# Patient Record
Sex: Male | Born: 1955 | Race: Black or African American | Hispanic: No | Marital: Married
Health system: Southern US, Community
[De-identification: ages and names within clinical notes are randomized; demographics above are authoritative.]

## PROBLEM LIST (undated history)

## (undated) DIAGNOSIS — I639 Cerebral infarction, unspecified: Secondary | ICD-10-CM

## (undated) DIAGNOSIS — Z992 Dependence on renal dialysis: Secondary | ICD-10-CM

## (undated) DIAGNOSIS — N186 End stage renal disease: Secondary | ICD-10-CM

## (undated) DIAGNOSIS — I10 Essential (primary) hypertension: Secondary | ICD-10-CM

---

## 2017-07-13 ENCOUNTER — Other Ambulatory Visit: Payer: Self-pay

## 2017-07-13 ENCOUNTER — Emergency Department: Payer: Self-pay

## 2017-07-13 ENCOUNTER — Inpatient Hospital Stay
Admission: EM | Admit: 2017-07-13 | Discharge: 2017-08-10 | DRG: 871 | Disposition: E | Payer: Self-pay | Source: Other Acute Inpatient Hospital | Attending: Internal Medicine | Admitting: Internal Medicine

## 2017-07-13 ENCOUNTER — Inpatient Hospital Stay: Payer: Self-pay

## 2017-07-13 DIAGNOSIS — Z7189 Other specified counseling: Secondary | ICD-10-CM

## 2017-07-13 DIAGNOSIS — J9601 Acute respiratory failure with hypoxia: Secondary | ICD-10-CM | POA: Diagnosis present

## 2017-07-13 DIAGNOSIS — I480 Paroxysmal atrial fibrillation: Secondary | ICD-10-CM | POA: Diagnosis present

## 2017-07-13 DIAGNOSIS — Z87891 Personal history of nicotine dependence: Secondary | ICD-10-CM

## 2017-07-13 DIAGNOSIS — S80821A Blister (nonthermal), right lower leg, initial encounter: Secondary | ICD-10-CM | POA: Diagnosis present

## 2017-07-13 DIAGNOSIS — G9341 Metabolic encephalopathy: Secondary | ICD-10-CM | POA: Diagnosis present

## 2017-07-13 DIAGNOSIS — N186 End stage renal disease: Secondary | ICD-10-CM | POA: Diagnosis present

## 2017-07-13 DIAGNOSIS — L03116 Cellulitis of left lower limb: Secondary | ICD-10-CM | POA: Diagnosis present

## 2017-07-13 DIAGNOSIS — Z8672 Personal history of thrombophlebitis: Secondary | ICD-10-CM

## 2017-07-13 DIAGNOSIS — Z4659 Encounter for fitting and adjustment of other gastrointestinal appliance and device: Secondary | ICD-10-CM

## 2017-07-13 DIAGNOSIS — Z1389 Encounter for screening for other disorder: Secondary | ICD-10-CM

## 2017-07-13 DIAGNOSIS — S80822A Blister (nonthermal), left lower leg, initial encounter: Secondary | ICD-10-CM | POA: Diagnosis present

## 2017-07-13 DIAGNOSIS — I469 Cardiac arrest, cause unspecified: Secondary | ICD-10-CM | POA: Diagnosis not present

## 2017-07-13 DIAGNOSIS — Z9911 Dependence on respirator [ventilator] status: Secondary | ICD-10-CM

## 2017-07-13 DIAGNOSIS — L03115 Cellulitis of right lower limb: Secondary | ICD-10-CM | POA: Diagnosis present

## 2017-07-13 DIAGNOSIS — Z978 Presence of other specified devices: Secondary | ICD-10-CM

## 2017-07-13 DIAGNOSIS — I69328 Other speech and language deficits following cerebral infarction: Secondary | ICD-10-CM

## 2017-07-13 DIAGNOSIS — Z833 Family history of diabetes mellitus: Secondary | ICD-10-CM

## 2017-07-13 DIAGNOSIS — I120 Hypertensive chronic kidney disease with stage 5 chronic kidney disease or end stage renal disease: Secondary | ICD-10-CM | POA: Diagnosis present

## 2017-07-13 DIAGNOSIS — D72819 Decreased white blood cell count, unspecified: Secondary | ICD-10-CM

## 2017-07-13 DIAGNOSIS — E1122 Type 2 diabetes mellitus with diabetic chronic kidney disease: Secondary | ICD-10-CM | POA: Diagnosis present

## 2017-07-13 DIAGNOSIS — R4182 Altered mental status, unspecified: Secondary | ICD-10-CM

## 2017-07-13 DIAGNOSIS — E872 Acidosis: Secondary | ICD-10-CM | POA: Diagnosis present

## 2017-07-13 DIAGNOSIS — D631 Anemia in chronic kidney disease: Secondary | ICD-10-CM | POA: Diagnosis present

## 2017-07-13 DIAGNOSIS — D696 Thrombocytopenia, unspecified: Secondary | ICD-10-CM

## 2017-07-13 DIAGNOSIS — Z992 Dependence on renal dialysis: Secondary | ICD-10-CM

## 2017-07-13 DIAGNOSIS — I69351 Hemiplegia and hemiparesis following cerebral infarction affecting right dominant side: Secondary | ICD-10-CM

## 2017-07-13 DIAGNOSIS — A4159 Other Gram-negative sepsis: Principal | ICD-10-CM

## 2017-07-13 DIAGNOSIS — D61818 Other pancytopenia: Secondary | ICD-10-CM | POA: Diagnosis present

## 2017-07-13 DIAGNOSIS — Z8249 Family history of ischemic heart disease and other diseases of the circulatory system: Secondary | ICD-10-CM

## 2017-07-13 DIAGNOSIS — Z794 Long term (current) use of insulin: Secondary | ICD-10-CM

## 2017-07-13 DIAGNOSIS — I447 Left bundle-branch block, unspecified: Secondary | ICD-10-CM | POA: Diagnosis present

## 2017-07-13 DIAGNOSIS — D539 Nutritional anemia, unspecified: Secondary | ICD-10-CM

## 2017-07-13 DIAGNOSIS — I248 Other forms of acute ischemic heart disease: Secondary | ICD-10-CM | POA: Diagnosis present

## 2017-07-13 DIAGNOSIS — I2510 Atherosclerotic heart disease of native coronary artery without angina pectoris: Secondary | ICD-10-CM | POA: Diagnosis present

## 2017-07-13 DIAGNOSIS — Z66 Do not resuscitate: Secondary | ICD-10-CM | POA: Diagnosis not present

## 2017-07-13 DIAGNOSIS — Z01818 Encounter for other preprocedural examination: Secondary | ICD-10-CM

## 2017-07-13 DIAGNOSIS — Z515 Encounter for palliative care: Secondary | ICD-10-CM

## 2017-07-13 DIAGNOSIS — D65 Disseminated intravascular coagulation [defibrination syndrome]: Secondary | ICD-10-CM | POA: Diagnosis present

## 2017-07-13 DIAGNOSIS — A419 Sepsis, unspecified organism: Secondary | ICD-10-CM | POA: Diagnosis present

## 2017-07-13 DIAGNOSIS — R6521 Severe sepsis with septic shock: Secondary | ICD-10-CM | POA: Diagnosis present

## 2017-07-13 HISTORY — DX: Essential (primary) hypertension: I10

## 2017-07-13 HISTORY — DX: End stage renal disease: N18.6

## 2017-07-13 HISTORY — DX: Dependence on renal dialysis: Z99.2

## 2017-07-13 HISTORY — DX: Cerebral infarction, unspecified: I63.9

## 2017-07-13 LAB — COMPREHENSIVE METABOLIC PANEL
ALBUMIN: 2.6 g/dL — AB (ref 3.5–5.0)
ALT: 34 U/L (ref 17–63)
ALT: 34 U/L (ref 17–63)
ANION GAP: 18 — AB (ref 5–15)
ANION GAP: 24 — AB (ref 5–15)
AST: 52 U/L — AB (ref 15–41)
AST: 68 U/L — ABNORMAL HIGH (ref 15–41)
Albumin: 2 g/dL — ABNORMAL LOW (ref 3.5–5.0)
Alkaline Phosphatase: 139 U/L — ABNORMAL HIGH (ref 38–126)
Alkaline Phosphatase: 120 U/L (ref 38–126)
BILIRUBIN TOTAL: 4.9 mg/dL — AB (ref 0.3–1.2)
BUN: 82 mg/dL — AB (ref 6–20)
BUN: 74 mg/dL — ABNORMAL HIGH (ref 6–20)
CALCIUM: 7.6 mg/dL — AB (ref 8.9–10.3)
CHLORIDE: 96 mmol/L — AB (ref 101–111)
CHLORIDE: 96 mmol/L — AB (ref 101–111)
CO2: 24 mmol/L (ref 22–32)
CO2: 14 mmol/L — AB (ref 22–32)
Calcium: 8.5 mg/dL — ABNORMAL LOW (ref 8.9–10.3)
Creatinine, Ser: 6.69 mg/dL — ABNORMAL HIGH (ref 0.61–1.24)
Creatinine, Ser: 6.75 mg/dL — ABNORMAL HIGH (ref 0.61–1.24)
GFR calc Af Amer: 9 mL/min — ABNORMAL LOW (ref >60)
GFR calc non Af Amer: 8 mL/min — ABNORMAL LOW (ref >60)
GFR calc non Af Amer: 8 mL/min — ABNORMAL LOW (ref >60)
GFR, EST AFRICAN AMERICAN: 9 mL/min — AB (ref >60)
GLUCOSE: 64 mg/dL — AB (ref 65–99)
Glucose, Bld: 207 mg/dL — ABNORMAL HIGH (ref 65–99)
POTASSIUM: 4.8 mmol/L (ref 3.5–5.1)
Potassium: 4.5 mmol/L (ref 3.5–5.1)
Sodium: 138 mmol/L (ref 135–145)
Sodium: 134 mmol/L — ABNORMAL LOW (ref 135–145)
TOTAL PROTEIN: 6.7 g/dL (ref 6.5–8.1)
Total Bilirubin: 4.3 mg/dL — ABNORMAL HIGH (ref 0.3–1.2)
Total Protein: 5.4 g/dL — ABNORMAL LOW (ref 6.5–8.1)

## 2017-07-13 LAB — BLOOD GAS, ARTERIAL
ACID-BASE DEFICIT: 7.5 mmol/L — AB (ref 0.0–2.0)
ACID-BASE DEFICIT: 21.3 mmol/L — AB (ref 0.0–2.0)
ACID-BASE DEFICIT: 17.2 mmol/L — AB (ref 0.0–2.0)
BICARBONATE: 16.3 mmol/L — AB (ref 20.0–28.0)
BICARBONATE: 7.1 mmol/L — AB (ref 20.0–28.0)
BICARBONATE: 9 mmol/L — AB (ref 20.0–28.0)
FIO2: 28
FIO2: 0.5
FIO2: 0.3
LHR: 16 {breaths}/min
O2 SAT: 87.9 %
O2 Saturation: 98.4 %
O2 Saturation: 97.5 %
PCO2 ART: 27 mmHg — AB (ref 32.0–48.0)
PCO2 ART: 24 mmHg — AB (ref 32.0–48.0)
PCO2 ART: 23 mmHg — AB (ref 32.0–48.0)
PEEP/CPAP: 5 cmH2O
PEEP: 5 cmH2O
PH ART: 7.08 — AB (ref 7.350–7.450)
PH ART: 7.2 — AB (ref 7.350–7.450)
PO2 ART: 55 mmHg — AB (ref 83.0–108.0)
Patient temperature: 37
Patient temperature: 37
Patient temperature: 37
RATE: 16 resp/min
VT: 500 mL
VT: 500 mL
pH, Arterial: 7.39 (ref 7.350–7.450)
pO2, Arterial: 149 mmHg — ABNORMAL HIGH (ref 83.0–108.0)
pO2, Arterial: 117 mmHg — ABNORMAL HIGH (ref 83.0–108.0)

## 2017-07-13 LAB — CBC
HCT: 31.1 % — ABNORMAL LOW (ref 40.0–52.0)
Hemoglobin: 9.5 g/dL — ABNORMAL LOW (ref 13.0–18.0)
MCH: 32.2 pg (ref 26.0–34.0)
MCHC: 30.4 g/dL — AB (ref 32.0–36.0)
MCV: 105.9 fL — ABNORMAL HIGH (ref 80.0–100.0)
PLATELETS: 117 10*3/uL — AB (ref 150–440)
RBC: 2.94 MIL/uL — ABNORMAL LOW (ref 4.40–5.90)
RDW: 21 % — AB (ref 11.5–14.5)
WBC: 1.8 10*3/uL — ABNORMAL LOW (ref 3.8–10.6)

## 2017-07-13 LAB — TROPONIN I
TROPONIN I: 2.07 ng/mL — AB (ref <0.03)
TROPONIN I: 1.81 ng/mL — AB (ref <0.03)
Troponin I: 1.92 ng/mL (ref <0.03)

## 2017-07-13 LAB — PROTIME-INR
INR: 1.65
Prothrombin Time: 19.4 seconds — ABNORMAL HIGH (ref 11.4–15.2)

## 2017-07-13 LAB — CBC WITH DIFFERENTIAL/PLATELET
BASOS PCT: 0 %
Band Neutrophils: 4 %
Basophils Absolute: 0 10*3/uL (ref 0–0.1)
Blasts: 0 %
EOS PCT: 4 %
Eosinophils Absolute: 0 10*3/uL (ref 0–0.7)
HEMATOCRIT: 32.8 % — AB (ref 40.0–52.0)
HEMOGLOBIN: 10.7 g/dL — AB (ref 13.0–18.0)
LYMPHS PCT: 14 %
Lymphs Abs: 0.1 10*3/uL — ABNORMAL LOW (ref 1.0–3.6)
MCH: 32.9 pg (ref 26.0–34.0)
MCHC: 32.7 g/dL (ref 32.0–36.0)
MCV: 100.6 fL — AB (ref 80.0–100.0)
MYELOCYTES: 8 %
Metamyelocytes Relative: 2 %
Monocytes Absolute: 0.1 10*3/uL — ABNORMAL LOW (ref 0.2–1.0)
Monocytes Relative: 10 %
Neutro Abs: 0.8 10*3/uL — ABNORMAL LOW (ref 1.4–6.5)
Neutrophils Relative %: 58 %
OTHER: 0 %
PROMYELOCYTES RELATIVE: 0 %
Platelets: 141 10*3/uL — ABNORMAL LOW (ref 150–440)
RBC: 3.26 MIL/uL — AB (ref 4.40–5.90)
RDW: 20.4 % — AB (ref 11.5–14.5)
WBC: 1 10*3/uL — AB (ref 3.8–10.6)
nRBC: 2 /100 WBC — ABNORMAL HIGH

## 2017-07-13 LAB — TSH: TSH: 4.797 u[IU]/mL — AB (ref 0.350–4.500)

## 2017-07-13 LAB — MRSA PCR SCREENING: MRSA BY PCR: NEGATIVE

## 2017-07-13 LAB — GLUCOSE, CAPILLARY
GLUCOSE-CAPILLARY: 58 mg/dL — AB (ref 65–99)
GLUCOSE-CAPILLARY: 105 mg/dL — AB (ref 65–99)
GLUCOSE-CAPILLARY: 95 mg/dL (ref 65–99)

## 2017-07-13 LAB — PROCALCITONIN: PROCALCITONIN: 71.26 ng/mL

## 2017-07-13 LAB — PATHOLOGIST SMEAR REVIEW

## 2017-07-13 LAB — RETICULOCYTES
RBC.: 3.04 MIL/uL — ABNORMAL LOW (ref 4.40–5.90)
Retic Count, Absolute: 73 10*3/uL (ref 19.0–183.0)
Retic Ct Pct: 2.4 % (ref 0.4–3.1)

## 2017-07-13 LAB — FOLATE: Folate: 10.3 ng/mL (ref >5.9)

## 2017-07-13 LAB — LACTIC ACID, PLASMA
LACTIC ACID, VENOUS: 5.5 mmol/L — AB (ref 0.5–1.9)
Lactic Acid, Venous: 7.4 mmol/L (ref 0.5–1.9)

## 2017-07-13 LAB — LACTATE DEHYDROGENASE: LDH: 270 U/L — ABNORMAL HIGH (ref 98–192)

## 2017-07-13 LAB — TRIGLYCERIDES: Triglycerides: 168 mg/dL — ABNORMAL HIGH (ref <150)

## 2017-07-13 MED ORDER — MIDAZOLAM HCL 2 MG/2ML IJ SOLN: 2.0000 mg | INTRAMUSCULAR | Status: DC | PRN

## 2017-07-13 MED ORDER — ACETAMINOPHEN 650 MG RE SUPP: 650.0000 mg | Freq: Four times a day (QID) | RECTAL | Status: DC | PRN

## 2017-07-13 MED ORDER — FENTANYL CITRATE (PF) 100 MCG/2ML IJ SOLN
INTRAMUSCULAR | Status: AC
  Administered 2017-07-13: 50 ug via INTRAVENOUS
  Filled 2017-07-13: qty 2

## 2017-07-13 MED ORDER — PROPOFOL 1000 MG/100ML IV EMUL
5.0000 ug/kg/min | INTRAVENOUS | Status: DC
  Administered 2017-07-13: 5 ug/kg/min via INTRAVENOUS
  Administered 2017-07-14 – 2017-07-15 (×4): 20 ug/kg/min via INTRAVENOUS
  Filled 2017-07-13: qty 100
  Filled 2017-07-13: qty 200
  Filled 2017-07-13: qty 100

## 2017-07-13 MED ORDER — ASPIRIN EC 81 MG PO TBEC
81.0000 mg | DELAYED_RELEASE_TABLET | Freq: Every day | ORAL | Status: DC
  Administered 2017-07-14: 81 mg via ORAL
  Filled 2017-07-13 (×2): qty 1

## 2017-07-13 MED ORDER — VANCOMYCIN HCL IN DEXTROSE 750-5 MG/150ML-% IV SOLN
750.0000 mg | INTRAVENOUS | Status: DC | PRN
  Filled 2017-07-13: qty 150

## 2017-07-13 MED ORDER — SODIUM CHLORIDE 0.9 % IV SOLN
250.0000 mL | INTRAVENOUS | Status: DC | PRN
Start: 2017-07-13 — End: 2017-07-15

## 2017-07-13 MED ORDER — DEXTROSE 50 % IV SOLN
1.0000 | Freq: Once | INTRAVENOUS | Status: AC
  Administered 2017-07-13: 50 mL via INTRAVENOUS

## 2017-07-13 MED ORDER — NOREPINEPHRINE 16 MG/250ML-% IV SOLN
0.0000 ug/min | INTRAVENOUS | Status: DC
  Administered 2017-07-13: 4 ug/min via INTRAVENOUS
  Filled 2017-07-13 (×2): qty 250

## 2017-07-13 MED ORDER — SENNOSIDES-DOCUSATE SODIUM 8.6-50 MG PO TABS: 1.0000 | ORAL_TABLET | Freq: Every evening | ORAL | Status: DC | PRN

## 2017-07-13 MED ORDER — SODIUM BICARBONATE 8.4 % IV SOLN
INTRAVENOUS | Status: DC
  Administered 2017-07-13: 21:00:00 via INTRAVENOUS
  Filled 2017-07-13 (×3): qty 150

## 2017-07-13 MED ORDER — DEXTROSE 50 % IV SOLN
INTRAVENOUS | Status: AC
  Administered 2017-07-13: 50 mL via INTRAVENOUS
  Filled 2017-07-13: qty 50

## 2017-07-13 MED ORDER — FENTANYL CITRATE (PF) 100 MCG/2ML IJ SOLN: 100.0000 ug | INTRAMUSCULAR | Status: DC | PRN

## 2017-07-13 MED ORDER — DEXTROSE 10 % IV SOLN
INTRAVENOUS | Status: DC
  Administered 2017-07-13 – 2017-07-15 (×3): via INTRAVENOUS

## 2017-07-13 MED ORDER — VANCOMYCIN HCL IN DEXTROSE 1-5 GM/200ML-% IV SOLN
1000.0000 mg | Freq: Once | INTRAVENOUS | Status: AC
  Administered 2017-07-13: 1000 mg via INTRAVENOUS
  Filled 2017-07-13: qty 200

## 2017-07-13 MED ORDER — ETOMIDATE 2 MG/ML IV SOLN
INTRAVENOUS | Status: AC
  Administered 2017-07-13: 20 mg via INTRAVENOUS
  Filled 2017-07-13: qty 10

## 2017-07-13 MED ORDER — ACETAMINOPHEN 325 MG PO TABS: 650.0000 mg | ORAL_TABLET | Freq: Four times a day (QID) | ORAL | Status: DC | PRN

## 2017-07-13 MED ORDER — FENTANYL CITRATE (PF) 100 MCG/2ML IJ SOLN
50.0000 ug | Freq: Once | INTRAMUSCULAR | Status: AC
  Administered 2017-07-13: 50 ug via INTRAVENOUS

## 2017-07-13 MED ORDER — SODIUM CHLORIDE 4 MEQ/ML IV SOLN: INTRAVENOUS | Status: DC

## 2017-07-13 MED ORDER — PIPERACILLIN-TAZOBACTAM 3.375 G IVPB 30 MIN
3.3750 g | Freq: Once | INTRAVENOUS | Status: AC
  Administered 2017-07-13: 3.375 g via INTRAVENOUS
  Filled 2017-07-13: qty 50

## 2017-07-13 MED ORDER — SODIUM BICARBONATE 8.4 % IV SOLN
150.0000 meq | Freq: Once | INTRAVENOUS | Status: AC
  Administered 2017-07-14: 150 meq via INTRAVENOUS
  Filled 2017-07-13: qty 150

## 2017-07-13 MED ORDER — HEPARIN SODIUM (PORCINE) 5000 UNIT/ML IJ SOLN
5000.0000 [IU] | Freq: Three times a day (TID) | INTRAMUSCULAR | Status: DC
  Administered 2017-07-13 – 2017-07-15 (×5): 5000 [IU] via SUBCUTANEOUS
  Filled 2017-07-13 (×5): qty 1

## 2017-07-13 MED ORDER — PIPERACILLIN-TAZOBACTAM 3.375 G IVPB
3.3750 g | Freq: Two times a day (BID) | INTRAVENOUS | Status: DC
  Administered 2017-07-14 – 2017-07-15 (×3): 3.375 g via INTRAVENOUS
  Filled 2017-07-13 (×3): qty 50

## 2017-07-13 MED ORDER — ONDANSETRON HCL 4 MG PO TABS: 4.0000 mg | ORAL_TABLET | Freq: Four times a day (QID) | ORAL | Status: DC | PRN

## 2017-07-13 MED ORDER — MIDAZOLAM HCL 2 MG/2ML IJ SOLN
1.0000 mg | Freq: Once | INTRAMUSCULAR | Status: AC
  Administered 2017-07-13: 1 mg via INTRAVENOUS

## 2017-07-13 MED ORDER — SODIUM CHLORIDE 0.9% FLUSH: 3.0000 mL | INTRAVENOUS | Status: DC | PRN

## 2017-07-13 MED ORDER — FENTANYL CITRATE (PF) 100 MCG/2ML IJ SOLN
100.0000 ug | INTRAMUSCULAR | Status: DC | PRN
  Administered 2017-07-14: 100 ug via INTRAVENOUS
  Filled 2017-07-13: qty 2

## 2017-07-13 MED ORDER — SODIUM BICARBONATE 8.4 % IV SOLN
150.0000 meq | Freq: Once | INTRAVENOUS | Status: AC
  Administered 2017-07-13: 150 meq via INTRAVENOUS
  Filled 2017-07-13: qty 150

## 2017-07-13 MED ORDER — ONDANSETRON HCL 4 MG/2ML IJ SOLN: 4.0000 mg | Freq: Four times a day (QID) | INTRAMUSCULAR | Status: DC | PRN

## 2017-07-13 MED ORDER — ETOMIDATE 2 MG/ML IV SOLN
20.0000 mg | Freq: Once | INTRAVENOUS | Status: AC
  Administered 2017-07-13: 20 mg via INTRAVENOUS

## 2017-07-13 MED ORDER — SODIUM CHLORIDE 0.9% FLUSH
3.0000 mL | Freq: Two times a day (BID) | INTRAVENOUS | Status: DC
Start: 2017-07-13 — End: 2017-07-15
  Administered 2017-07-13 – 2017-07-15 (×4): 3 mL via INTRAVENOUS

## 2017-07-13 MED ORDER — DEXTROSE 50 % IV SOLN
1.0000 | INTRAVENOUS | Status: AC
  Filled 2017-07-13: qty 50

## 2017-07-13 MED ORDER — HYDROCODONE-ACETAMINOPHEN 5-325 MG PO TABS: 1.0000 | ORAL_TABLET | ORAL | Status: DC | PRN

## 2017-07-13 MED ORDER — TBO-FILGRASTIM 480 MCG/0.8ML ~~LOC~~ SOSY
480.0000 ug | PREFILLED_SYRINGE | Freq: Every day | SUBCUTANEOUS | Status: DC
  Administered 2017-07-13 – 2017-07-14 (×2): 480 ug via SUBCUTANEOUS
  Filled 2017-07-13 (×3): qty 0.8

## 2017-07-13 MED ORDER — MIDAZOLAM HCL 2 MG/2ML IJ SOLN
INTRAMUSCULAR | Status: AC
  Administered 2017-07-13: 1 mg via INTRAVENOUS
  Filled 2017-07-13: qty 2

## 2017-07-13 NOTE — Progress Notes (Signed)
Patient ID: Janace LittenReginald Romero, male   DOB: 06/16/1955, 62 y.o.   MRN: [id number] Pulmonary/critical care  Patient arrived in the intensive care unit unresponsive, breathing quickly with desaturation Proceeded to emergent intubation  Procedure note Intubation Indication: Hypoxemic respiratory failure Glide scope utilized for direct visualization #4 blade was able to visualize vocal cords. Mucus and pus appeared to be coming from posterior glottic area #7.5 endotracheal tube passed without difficulty End-tidal CO2 Bilateral breath sounds Condensation endotracheal tube We'll send respiratory culture Stat portable chest x-ray ordered Will place oral gastric tube in addition  M.D.C. HoldingsJohn Laurieanne Galloway, D.O.

## 2017-07-13 NOTE — ED Notes (Signed)
Patient transported to CT 

## 2017-07-13 NOTE — Consult Note (Signed)
Reason for Consult:Sepsis Referring Physician: Dr. [name] is an 62 y.o. male.  HPI:  Mr. Marcus Romero is a 62 year-old African- past medical history remarkable for prior CVA, hypertension and end-stage renal disease on hemodialysis,was brought into the emergency department after passing out during hemodialysis. Apparently he had 1-1/2 hours completed. CT scan of the head was performed which did not reveal any acute intracranial abnormality and revealed chronic basal ganglia and thalamic lacunar infarcts. Patient is confused and tachypneic at this time, limited history.Pertinent labs are remarkable for a lactic acid of 7.4, white count of 1, hemoglobin of 10.7, platelet count 141,bUN of 82 and serum creatinine of 6.69 with an anion gap of 18. Troponin was elevated at 2.07. EKG reveals left bundle branch block.   Past Medical History:  Diagnosis Date  . CVA (cerebral vascular accident) ([place])   . ESRD on dialysis ([place])   . Hypertension     History reviewed. No pertinent surgical history.  No family history on file.  Social History:  has no tobacco, alcohol, and drug history on file.  Allergies: Not on File  Medications: I have reviewed the patient's current medications.  Results for orders placed or performed during the hospital encounter of 07/24/2017 (from the past 48 hour(s))  Comprehensive metabolic panel     Status: Abnormal   Collection Time: 08/04/2017  1:23 PM  Result Value Ref Range   Sodium 138 135 - 145 mmol/L   Potassium 4.5 3.5 - 5.1 mmol/L   Chloride 96 (L) 101 - 111 mmol/L   CO2 24 22 - 32 mmol/L   Glucose, Bld 64 (L) 65 - 99 mg/dL   BUN 82 (H) 6 - 20 mg/dL   Creatinine, Ser 6.69 (H) 0.61 - 1.24 mg/dL   Calcium 8.5 (L) 8.9 - 10.3 mg/dL   Total Protein 6.7 6.5 - 8.1 g/dL   Albumin 2.6 (L) 3.5 - 5.0 g/dL   AST 52 (H) 15 - 41 U/L   ALT 34 17 - 63 U/L   Alkaline Phosphatase 139 (H) 38 - 126 U/L   Total Bilirubin 4.9 (H) 0.3 - 1.2 mg/dL   GFR calc non Af Amer 8  (L) >60 mL/min   GFR calc Af Amer 9 (L) >60 mL/min    Comment: (NOTE) The eGFR has been calculated using the CKD EPI equation. This calculation has not been validated in all clinical situations. eGFR's persistently <60 mL/min signify possible Chronic Kidney Disease.    Anion gap 18 (H) 5 - 15    Comment: Performed at [facility], [address] [place], [place] [address]  Lactic acid, plasma     Status: Abnormal   Collection Time: 07/27/2017  1:23 PM  Result Value Ref Range   Lactic Acid, Venous 5.5 (HH) 0.5 - 1.9 mmol/L    Comment: CRITICAL RESULT CALLED TO, READ BACK BY AND VERIFIED WITH CATHY [facility] RN AT 1410 07/28/2017. MSS Performed at [facility], [address] [place], [place] [address]   CBC with Differential     Status: Abnormal   Collection Time: 07/12/2017  1:23 PM  Result Value Ref Range   WBC 1.0 (LL) 3.8 - 10.6 K/uL    Comment: CRITICAL RESULT CALLED TO, READ BACK BY AND VERIFIED WITH: CASSIE [facility] AT 5361 07/12/2017.  TFK    RBC 3.26 (L) 4.40 - 5.90 MIL/uL   Hemoglobin 10.7 (L) 13.0 - 18.0 g/dL   HCT 32.8 (L) 40.0 - 52.0 %  MCV 100.6 (H) 80.0 - 100.0 fL   MCH 32.9 26.0 - 34.0 pg   MCHC 32.7 32.0 - 36.0 g/dL   RDW 20.4 (H) 11.5 - 14.5 %   Platelets 141 (L) 150 - 440 K/uL   Neutrophils Relative % 58 %   Lymphocytes Relative 14 %   Monocytes Relative 10 %   Eosinophils Relative 4 %   Basophils Relative 0 %   Band Neutrophils 4 %   Metamyelocytes Relative 2 %   Myelocytes 8 %   Promyelocytes Relative 0 %   Blasts 0 %   nRBC 2 (H) 0 /100 WBC   Other 0 %   Neutro Abs 0.8 (L) 1.4 - 6.5 K/uL   Lymphs Abs 0.1 (L) 1.0 - 3.6 K/uL   Monocytes Absolute 0.1 (L) 0.2 - 1.0 K/uL   Eosinophils Absolute 0.0 0 - 0.7 K/uL   Basophils Absolute 0.0 0 - 0.1 K/uL    Comment: Performed at [facility], [address] [place], [place] [address]  Protime-INR     Status: Abnormal   Collection Time: 07/25/2017  1:23 PM  Result Value Ref Range    Prothrombin Time 19.4 (H) 11.4 - 15.2 seconds   INR 1.65     Comment: Performed at [facility], [address] [place], [place] [address]  Troponin I     Status: Abnormal   Collection Time: 08/02/2017  1:23 PM  Result Value Ref Range   Troponin I 2.07 (HH) <0.03 ng/mL    Comment: CRITICAL RESULT CALLED TO, READ BACK BY AND VERIFIED WITHTye [facility] RN AT 1410 07/26/2017. MSS Performed at [facility], [address] [place], [place] [address]   Pathologist smear review     Status: None   Collection Time: 07/18/2017  1:23 PM  Result Value Ref Range   Path Review      Smear review shows pancytopenia with polychromasia and few nucelated RBCs. 4 schistocytes in 20 hpfs are seen. Burr cells and poikilocytosis. If changes on CBC do not resolve after treatment of current condition, hematology oncology consult is  recommended.  Dr. Luana Shu.      Comment: Performed at [facility], [address] [place], [place] [address]  Blood gas, arterial     Status: Abnormal   Collection Time: 08/03/2017  3:26 PM  Result Value Ref Range   FIO2 28.00    Delivery systems NASAL CANNULA    pH, Arterial 7.39 7.350 - 7.450   pCO2 arterial 27 (L) 32.0 - 48.0 mmHg   pO2, Arterial 55 (L) 83.0 - 108.0 mmHg   Bicarbonate 16.3 (L) 20.0 - 28.0 mmol/L   Acid-base deficit 7.5 (H) 0.0 - 2.0 mmol/L   O2 Saturation 87.9 %   Patient temperature 37.0    Collection site RIGHT RADIAL    Sample type ARTERIAL DRAW    Allens test (pass/fail) PASS PASS    Comment: Performed at [facility], [address] [place],  [address]    Ct Head Wo Contrast  Result Date: 08/05/2017 CLINICAL DATA:  Altered level of consciousness EXAM: CT HEAD WITHOUT CONTRAST TECHNIQUE: Contiguous axial images were obtained from the base of the skull through the vertex without intravenous contrast. COMPARISON:  None. FINDINGS: Brain: Moderate chronic microvascular disease throughout the deep white matter.  Chronic appearing bilateral basal ganglia and thalamic lacunar infarcts. No acute intracranial abnormality. Specifically, no hemorrhage, hydrocephalus, mass lesion, acute infarction, or significant intracranial injury. Vascular: No  hyperdense vessel or unexpected calcification. Skull: No acute calvarial abnormality. Sinuses/Orbits: Visualized paranasal sinuses and mastoids clear. Orbital soft tissues unremarkable. Other: None IMPRESSION: Chronic microvascular disease. Chronic appearing bilateral basal ganglia and thalamic lacunar infarcts. No acute intracranial abnormality. Electronically Signed   By: Rolm Baptise M.D.   On: 07/23/2017 14:01   Dg Chest Port 1 View  Result Date: 08/01/2017 CLINICAL DATA:  Shortness of breath. Altered mental status EXAM: PORTABLE CHEST 1 VIEW COMPARISON:  None. FINDINGS: Cardiomegaly and coronary stenting. Aortic atherosclerosis. Low lung volumes. There is no edema, consolidation, effusion, or pneumothorax. IMPRESSION: No evidence of active disease. Cardiomegaly. Electronically Signed   By: Monte Fantasia M.D.   On: 07/14/2017 13:47    ROS Blood pressure (!) 140/104, pulse 91, temperature 98.6 F (37 C), temperature source Axillary, resp. rate (!) 44, height _0  (1.575 m), weight 190 lb (86.2 kg), SpO2 93 %. Physical Exam  Patient is an ill-appearing African-American male. Presently awake but confused, tachypneic Vital signs: Please see the above listed vital signs HEENT: Mucosa looks dry, trachea is midline, no stridor appreciated, some accessory muscle utilization Cardiovascular: Regular rate and rhythm, systolic murmur left parasternal border Pulmonary: Clear Abdominal: Hypoactive bowel sounds, generally soft exam Extremities: Patient has chronic venous stasis noted in lower extremity with multiple sores and weeping wounds Neurologic: Limited exam, patient is awake but confused  Assessment/Plan:  Loss of consciousness. Unclear etiology, patient did not have  a witnessed seizure, CT scan of his head did not reveal any acute hemorrhagic event, presently awake but confused in emergency department. This mail be metabolic/toxic encephalopathy.  Will ask neurology in consultation  Septic shock. Patient's blood pressure marginal, is leukopenic, multiple cutaneous sores and ulcerations, elevated lactic acid CONSISTENT with septic picture. Pan culture and start on broad-spectrum antibiotic coverage. Wound care consultation  Leukopenia. May be secondary to sepsis and marrow depression. Hematology has been consulted  Lactic acidosis. Secondary to sepsis.   Elevated troponin. EKG reveals left bundle branch block, may be supply demand ischemia, will follow closely and may need cardiology consultation  Anemia. No evidence of active bleeding  Prior history of CVA. Head CT revealed old lacunar infarcts. Please see report, no acute intracranial event  Will need to touch base with family, patient appears ill with a high likelihood of deteriorating requiring pressors and intubation.  Critical care time 40 minutes  Daimion Adamcik 07/11/2017, 3:47 PM

## 2017-07-13 NOTE — ED Provider Notes (Signed)
[facility] Emergency Department Provider Note  ___________________________________________   First MD Initiated Contact with Patient 07/11/2017 1321     (approximate)  I have reviewed the triage vital signs and the nursing notes.   HISTORY  Chief Complaint Loss of Consciousness  HPI Marcus LittenReginald Romero is a 62 y.o. male with history of end-stage renal disease on dialysis who is presenting to the emergency department today with altered mental status.  Per EMS the patient was normal upon arrival to dialysis 2 hours ago.  He then became unresponsive for 2 to 3 minutes.  Per EMS he also has a baseline right-sided weakness as well as slurred speech.  EMS said that he was responsive only to sternal rub when they arrived however has become more responsive throughout the transport to the emergency department.  Patient, secondary to medical condition unable to give further history at this time.  History reviewed. No pertinent past medical history.  There are no active problems to display for this patient.   History reviewed. No pertinent surgical history.  Prior to Admission medications   Not on File    Allergies Patient has no allergy information on record.  No family history on file.  Social History Social History   Tobacco Use  . Smoking status: Not on file  Substance Use Topics  . Alcohol use: Not on file  . Drug use: Not on file    Review of Systems  Level 5 caveat secondary to altered mental status.  ____________________________________________   PHYSICAL EXAM:  VITAL SIGNS: ED Triage Vitals  Enc Vitals Group     BP 08/05/2017 1319 114/71     Pulse Rate 08/07/2017 1319 91     Resp 07/28/2017 1319 (!) 38     Temp 07/20/2017 1319 98.6 F (37 C)     Temp Source 07/14/2017 1319 Axillary     SpO2 07/18/2017 1319 93 %     Weight 07/12/2017 1402 190 lb (86.2 kg)     Height 07/12/2017 1321 5\' 2"  (1.575 m)     Head Circumference --      Peak Flow --    Pain Score 07/16/2017 1321 0     Pain Loc --      Pain Edu? --      Excl. in GC? --     Constitutional: Alert but with garbled speech.  No distress. Eyes: Conjunctivae are normal.  Head: Atraumatic. Nose: No congestion/rhinnorhea. Mouth/Throat: Mucous membranes are moist.  Neck: No stridor.   Cardiovascular: Normal rate, regular rhythm. Grossly normal heart sounds.  Left-sided dialysis fistula with palpable thrill. Respiratory: Cryptic with clear lungs throughout all fields. Gastrointestinal: Soft and nontender. No distention.  Musculoskeletal:  Lateral lower extremity edema with multiple open wounds especially to the left posterior calf and left heel.  No appreciable warmth or erythema.  However, there is skin thickening likely secondary to chronic stasis throughout both lower extremities.  Edema is moderate bilaterally.  Neurologic: Slurred speech with right upper extremity weakness. Skin: As above musculoskeletal   ____________________________________________   LABS (all labs ordered are listed, but only abnormal results are displayed)  Labs Reviewed  COMPREHENSIVE METABOLIC PANEL - Abnormal; Notable for the following components:      Result Value   Chloride 96 (*)    Glucose, Bld 64 (*)    BUN 82 (*)    Creatinine, Ser 6.69 (*)    Calcium 8.5 (*)    Albumin 2.6 (*)    AST  52 (*)    Alkaline Phosphatase 139 (*)    Total Bilirubin 4.9 (*)    GFR calc non Af Amer 8 (*)    GFR calc Af Amer 9 (*)    Anion gap 18 (*)    All other components within normal limits  LACTIC ACID, PLASMA - Abnormal; Notable for the following components:   Lactic Acid, Venous 5.5 (*)    All other components within normal limits  CBC WITH DIFFERENTIAL/PLATELET - Abnormal; Notable for the following components:   RBC 3.26 (*)    Hemoglobin 10.7 (*)    HCT 32.8 (*)    MCV 100.6 (*)    RDW 20.4 (*)    Platelets 141 (*)    All other components within normal limits  PROTIME-INR - Abnormal;  Notable for the following components:   Prothrombin Time 19.4 (*)    All other components within normal limits  TROPONIN I - Abnormal; Notable for the following components:   Troponin I 2.07 (*)    All other components within normal limits  CULTURE, BLOOD (ROUTINE X 2)  CULTURE, BLOOD (ROUTINE X 2)  LACTIC ACID, PLASMA   ____________________________________________  EKG  ED ECG REPORT I, Arelia Longest, the attending physician, personally viewed and interpreted this ECG.   Date: 07/30/2017  EKG Time: 1326  Rate: 90  Rhythm: normal sinus rhythm  Axis: Normal  Intervals:left bundle branch block  ST&T Change: ST segment depression in V5 and V6 of approximately 2 mm with associated T wave inversion.  Also with T wave inversion in 1, 2 and aVL.  Unable to access the images from previous EKGs.  However on care everywhere I am able to read multiple EKGs with left ventricular hypertrophy with QRS widening and repolarization abnormalities.  This appears consistent with what I am seeing on the EKG today. ____________________________________________  RADIOLOGY  Chest x-ray without acute finding.  CT head without acute finding. ____________________________________________   PROCEDURES  Procedure(s) performed:   .Critical Care Performed by: Myrna Blazer, MD Authorized by: Myrna Blazer, MD   Critical care provider statement:    Critical care time (minutes):  35   Critical care time was exclusive of:  Separately billable procedures and treating other patients   Critical care was time spent personally by me on the following activities:  Development of treatment plan with patient or surrogate, discussions with consultants, evaluation of patient's response to treatment, examination of patient, obtaining history from patient or surrogate, ordering and performing treatments and interventions, ordering and review of laboratory studies, ordering and review of  radiographic studies, pulse oximetry, re-evaluation of patient's condition and review of old charts    Critical Care performed:    ____________________________________________   INITIAL IMPRESSION / ASSESSMENT AND PLAN / ED COURSE  Pertinent labs & imaging results that were available during my care of the patient were reviewed by me and considered in my medical decision making (see chart for details).  Differential diagnosis includes, but is not limited to, alcohol, illicit or prescription medications, or other toxic ingestion; intracranial pathology such as stroke or intracerebral hemorrhage; fever or infectious causes including sepsis; hypoxemia and/or hypercarbia; uremia; trauma; endocrine related disorders such as diabetes, hypoglycemia, and thyroid-related diseases; hypertensive encephalopathy; etc. As part of my medical decision making, I reviewed the following data within the electronic MEDICAL RECORD NUMBER Old chart reviewed and Notes from prior ED visits  ----------------------------------------- 3:40 PM on 08/04/2017 -----------------------------------------  Patient with elevated lactic acid,  decreased white blood cell count.  Likely sepsis.  Also with elevated troponin at 2.07.  Empiric antibiotics ordered.  We will go slow with IV fluids as the patient is a dialysis patient.  Had approximately 1.5 hours of dialysis today before the altered mental status occurred.  Also evaluated by Dr. Thad Ranger of neurology who does not see an indication for acute neurologic intervention at this time.  Discussed case with Dr. Tobi Bastos of internal medicine will be admitting the patient will be going to the ICU.  We will hold on heparin at this time as the patient's elevated troponin may be related to sepsis. ____________________________________________   FINAL CLINICAL IMPRESSION(S) / ED DIAGNOSES  .  Altered mental status.  Sepsis.    NEW MEDICATIONS STARTED DURING THIS VISIT:  New  Prescriptions   No medications on file     Note:  This document was prepared using Dragon voice recognition software and may include unintentional dictation errors.     Myrna Blazer, MD 07/31/2017 [phone]

## 2017-07-13 NOTE — Progress Notes (Signed)
Pharmacy Electrolyte Monitoring Consult:  Pharmacy consulted to assist in monitoring and replacing electrolytes in this 62 y.o. male admitted on 07/12/2017 with Loss of Consciousness  Patient currently intubated and sedated with propofol.   Bicarb infusion at 5175mL/hr.   Labs:  Sodium (mmol/L)  Date Value  08/01/2017 134 (L)   Potassium (mmol/L)  Date Value  07/31/2017 4.8   Calcium (mg/dL)  Date Value  40/98/119106/16/2019 7.6 (L)   Albumin (g/dL)  Date Value  47/82/956206/04/2017 2.0 (L)    Assessment/Plan: No replacement warranted.   Will recheck electrolytes with am labs.   Pharmacy will continue to monitor and adjust per consult.   Simpson,Michael L 07/13/2017 11:22 PM

## 2017-07-13 NOTE — ED Notes (Signed)
Date and time results received: Jun 11, 2017 1414  Test: Troponin Critical Value: 2.07  Name of Provider Notified: [place]

## 2017-07-13 NOTE — Progress Notes (Signed)
Patient arrived from ED unresponsive with leftwards gaze.  Both pupils have no reaction to light.  Dr. Lonn Georgiaonforti came to bedside to assess patient and stated that patient needed to be intubated.

## 2017-07-13 NOTE — Progress Notes (Signed)
Pharmacy Antibiotic Note  Marcus Romero is a 62 y.o. male on HD admitted on 08/02/2017 with sepsis.  He was approximately 1.5 hours into today's HD session. He received vancomycin 1000mg  and Zosyn 3.375g in the ED today at 1330. Pharmacy has been consulted for vancomycin and Zosyn dosing.  Plan: Vancomycin will be ordered after HD 750mg  IV.  Goal pre-HD is 15-25 mcg/mL and post-HD goal is 5-15 mcg/mL. Appropriate Zosyn dose in HD patients is 3.375g IE q12h. Vancomycin level will be drawn prior to the 3rd dose.  Height: 5\' 2"  (157.5 cm) Weight: 190 lb (86.2 kg) IBW/kg (Calculated) : 54.6  Temp (24hrs), Avg:98.6 F (37 C), Min:98.6 F (37 C), Max:98.6 F (37 C)  Recent Labs  Lab 04/06/2017 1323  WBC 1.0*  CREATININE 6.69*  LATICACIDVEN 5.5*    Estimated Creatinine Clearance: 10.6 mL/min (A) (by C-G formula based on SCr of 6.69 mg/dL (H)).    Not on File  Antimicrobials this admission: vancomycin 6/3 >> Zosyn 6/3>>   Microbiology results: 6/3 BCx: pending  Thank you for allowing pharmacy to be a part of this patient's care.  Lowella Bandyodney D Shiniqua Groseclose, PharmD 08/09/2017 3:02 PM

## 2017-07-13 NOTE — ED Notes (Signed)
This RN received call from daugther Maketina L Man states pt has in home care personal. Pt sister is the neighbor. Pt daugther states the emergency contact number is    Ralph LeydenMakentina Man [phone] Cell [phone] [facility](Home)  Daugther is to have sister call ED at this time.

## 2017-07-13 NOTE — H&P (Signed)
[facility] [place] at [facility]   PATIENT NAME: Marcus LittenReginald Romero    MR#:  [id number]  DATE OF BIRTH:  06/09/1955  DATE OF ADMISSION:  08/03/2017  PRIMARY CARE PHYSICIAN: Patient, No Pcp Per   REQUESTING/REFERRING PHYSICIAN:   CHIEF COMPLAINT:   Chief Complaint  Patient presents with  . Loss of Consciousness    HISTORY OF PRESENT ILLNESS: Marcus Romero  is a 62 y.o. male with a known history of end-stage renal disease on dialysis, hypertension, CVA in the past presented to the emergency room after patient passed out at dialysis.  Patient could not finish the dialysis completely he was brought to the emergency room.  Not much history could be obtained and the patient has he is lethargic and confused.  No family member was available at bedside.  Patient has low blood pressure and work-up in the emergency room showed elevated lactic acid level.  His troponin is also elevated.  Hospitalist service was consulted for evaluation for sepsis.  Patient has wounds in the both lower extremities which are weeping.  PAST MEDICAL HISTORY:   Past Medical History:  Diagnosis Date  . CVA (cerebral vascular accident) (HCC)   . ESRD on dialysis (HCC)   . Hypertension     PAST SURGICAL HISTORY:  Could not be obtained as patient is confused  SOCIAL HISTORY:  Could not be obtained secondary to patients lethargy and confusion  FAMILY HISTORY: unobtainable secondary to patients mental status  DRUG ALLERGIES: Not on File  REVIEW OF SYSTEMS:   CONSTITUTIONAL: No fever, fatigue or weakness.  EYES: No blurred or double vision.  EARS, NOSE, AND THROAT: No tinnitus or ear pain.  RESPIRATORY: No cough, shortness of breath, wheezing or hemoptysis.  CARDIOVASCULAR: No chest pain, orthopnea, edema.  GASTROINTESTINAL: No nausea, vomiting, diarrhea or abdominal pain.  GENITOURINARY: No dysuria, hematuria.  ENDOCRINE: No polyuria, nocturia,  HEMATOLOGY: No anemia, easy bruising  or bleeding SKIN: No rash or lesion. MUSCULOSKELETAL: No joint pain or arthritis.   NEUROLOGIC: No tingling, numbness, weakness.  PSYCHIATRY: No anxiety or depression.   MEDICATIONS AT HOME:  Prior to Admission medications   Medication Sig Start Date End Date Taking? Authorizing Provider  carvedilol (COREG) 25 MG tablet Take 25 mg by mouth 2 (two) times daily. 07/09/17  Yes [provider]  insulin NPH Human (HUMULIN N) 100 UNIT/ML injection Inject 15 Units into the skin 2 (two) times daily. 04/23/17 04/23/18 Yes [provider]  lisinopril (PRINIVIL,ZESTRIL) 40 MG tablet Take 40 mg by mouth daily. 05/17/17  Yes [provider]  loperamide (IMODIUM) 2 MG capsule Take 2 mg by mouth 3 (three) times daily as needed. 05/18/17  Yes [provider]      PHYSICAL EXAMINATION:   VITAL SIGNS: Blood pressure (!) 140/104, pulse 91, temperature 98.6 F (37 C), temperature source Axillary, resp. rate (!) 44, height 5\' 2"  (1.575 m), weight 86.2 kg (190 lb), SpO2 93 %.  GENERAL:  [age] y.o.-year-old patient lying in the bed on oxygen via nasal canula Has shortness of breath EYES: Pupils equal, round, reactive to light and accommodation. No scleral icterus. Extraocular muscles intact.  HEENT: Head atraumatic, normocephalic. Oropharynx and nasopharynx clear.  NECK:  Supple, no jugular venous distention. No thyroid enlargement, no tenderness.  LUNGS: Decreased breath sounds bilaterally, no wheezing, rales,rhonchi or crepitation. No use of accessory muscles of respiration.  CARDIOVASCULAR: S1, S2 normal. No murmurs, rubs, or gallops.  ABDOMEN: Soft, nontender, nondistended. Bowel sounds  present. No organomegaly or mass.  EXTREMITIES: No pedal edema, cyanosis, or clubbing.  NEUROLOGIC:  arousable to loud verbal commands Lethargic and confused Complete nervous system exam was not possible PSYCHIATRIC: Could not be assessed completely SKIN: No obvious rash, lesion, or ulcer.    LABORATORY PANEL:   CBC Recent Labs  Lab 2017/07/20 1323  WBC 1.0*  HGB 10.7*  HCT 32.8*  PLT 141*  MCV 100.6*  MCH 32.9  MCHC 32.7  RDW 20.4*  LYMPHSABS 0.1*  MONOABS 0.1*  EOSABS 0.0  BASOSABS 0.0   ------------------------------------------------------------------------------------------------------------------  Chemistries  Recent Labs  Lab 2017/07/20 1323  NA 138  K 4.5  CL 96*  CO2 24  GLUCOSE 64*  BUN 82*  CREATININE 6.69*  CALCIUM 8.5*  AST 52*  ALT 34  ALKPHOS 139*  BILITOT 4.9*   ------------------------------------------------------------------------------------------------------------------ estimated creatinine clearance is 10.6 mL/min (A) (by C-G formula based on SCr of 6.69 mg/dL (H)). ------------------------------------------------------------------------------------------------------------------ No results for input(s): TSH, T4TOTAL, T3FREE, THYROIDAB in the last 72 hours.  Invalid input(s): FREET3   Coagulation profile Recent Labs  Lab Jul 20, 2017 1323  INR 1.65   ------------------------------------------------------------------------------------------------------------------- No results for input(s): DDIMER in the last 72 hours. -------------------------------------------------------------------------------------------------------------------  Cardiac Enzymes Recent Labs  Lab 07/20/17 1323  TROPONINI 2.07*   ------------------------------------------------------------------------------------------------------------------ Invalid input(s): POCBNP  ---------------------------------------------------------------------------------------------------------------  Urinalysis No results found for: COLORURINE, APPEARANCEUR, LABSPEC, PHURINE, GLUCOSEU, HGBUR, BILIRUBINUR, KETONESUR, PROTEINUR, UROBILINOGEN, NITRITE, LEUKOCYTESUR   RADIOLOGY: Ct Head Wo Contrast  Result Date: 2017/07/20 CLINICAL DATA:  Altered level of consciousness EXAM:  CT HEAD WITHOUT CONTRAST TECHNIQUE: Contiguous axial images were obtained from the base of the skull through the vertex without intravenous contrast. COMPARISON:  None. FINDINGS: Brain: Moderate chronic microvascular disease throughout the deep white matter. Chronic appearing bilateral basal ganglia and thalamic lacunar infarcts. No acute intracranial abnormality. Specifically, no hemorrhage, hydrocephalus, mass lesion, acute infarction, or significant intracranial injury. Vascular: No hyperdense vessel or unexpected calcification. Skull: No acute calvarial abnormality. Sinuses/Orbits: Visualized paranasal sinuses and mastoids clear. Orbital soft tissues unremarkable. Other: None IMPRESSION: Chronic microvascular disease. Chronic appearing bilateral basal ganglia and thalamic lacunar infarcts. No acute intracranial abnormality. Electronically Signed   By: Charlett Nose M.D.   On: 07/20/2017 14:01   Dg Chest Port 1 View  Result Date: 20-Jul-2017 CLINICAL DATA:  Shortness of breath. Altered mental status EXAM: PORTABLE CHEST 1 VIEW COMPARISON:  None. FINDINGS: Cardiomegaly and coronary stenting. Aortic atherosclerosis. Low lung volumes. There is no edema, consolidation, effusion, or pneumothorax. IMPRESSION: No evidence of active disease. Cardiomegaly. Electronically Signed   By: Marnee Spring M.D.   On: 07/20/17 13:47    EKG: Orders placed or performed during the hospital encounter of 07-20-17  . ED EKG 12-Lead  . ED EKG 12-Lead  . EKG 12-Lead  . EKG 12-Lead  . EKG 12-Lead  . EKG 12-Lead    IMPRESSION AND PLAN:  62 year old male patient with history of end-stage renal disease on dialysis, hypertension, CVA in the past presented to the emergency room with lethargy, weakness and after passing out  -Sepsis Start patient on IV broad-spectrum antibiotics that is IV vancomycin and IV Zosyn IV fluids gently as patient is a dialysis patient Check blood cultures  -Altered mental status secondary to  sepsis Metabolic encephalopathy Treat underlying infection  -Severe leukopenia Hematology consultation Antibiotic coverage for infection  -End-stage renal disease on dialysis Nephrology consultation  -Lower extremity wounds and cellulitis IV antibiotics Wound care consultation  -Elevated troponin Could be secondary from sepsis  and renal failure Cycle troponin  -DVT prophylaxis with Morven heparin  -DW ICU Attending All the records are reviewed and case discussed with ED provider. Management plans discussed with the patient, family and they are in agreement.  CODE STATUS: Full code    TOTAL CRITICAL CARE TIME TAKING CARE OF THIS PATIENT: 53 minutes.    [name] M.D on 2017/07/16 at 3:41 PM  Between 7am to 6pm - Pager - [phone]  After 6pm go to [email or web address] - password [email or web address]  [facility]  Office  [phone]  CC: Primary care physician; Patient, No Pcp Per

## 2017-07-13 NOTE — ED Notes (Signed)
Date and time results received: Oct 16, 2017 1414   Test: lac Critical Value: 5.5  Name of Provider Notified: Schaevitz

## 2017-07-13 NOTE — Progress Notes (Signed)
Blood glucose rechecked and is 105.

## 2017-07-13 NOTE — Consult Note (Signed)
[facility] CONSULT NOTE  Patient Care Team: Patient, No Pcp Per as PCP - General (General Practice)  CHIEF COMPLAINTS/PURPOSE OF CONSULTATION:  Leukopenia anemia  HISTORY OF PRESENTING ILLNESS: Please note patient is currently intubated/sedated no family by the bedside. Marcus Romero 62 y.o.  male history of end-stage renal disease on hemodialysis; also history of cerebrovascular accidents-with chronic weakness; poorly controlled diabetes-is currently admitted to the hospital for acute mental status changes.  As per the report, patient had episode of syncope during dialysis-when he was brought to the hospital.  Work-up including CT of the brain noncontrast-negative for acute process.  However patient's neurologic status/respiratory status declined rapidly-during intubation/ICU transfer.  Of note patient's white count on admission was 1.8 hemoglobin 9.5 ANC 0.8 and platelets of 117.  Hematology has been consulted for further work-up and evaluation.  Review of Systems  Unable to perform ROS: Intubated     MEDICAL HISTORY:  Past Medical History:  Diagnosis Date  . CVA (cerebral vascular accident) (HCC)   . ESRD on dialysis (HCC)   . Hypertension     SURGICAL HISTORY: Fistula placement History reviewed. No pertinent surgical history.  SOCIAL HISTORY: Social History   Socioeconomic History  . Marital status: Married    Spouse name: Not on file  . Number of children: Not on file  . Years of education: Not on file  . Highest education level: Not on file  Occupational History  . Not on file  Social Needs  . Financial resource strain: Not on file  . Food insecurity:    Worry: Not on file    Inability: Not on file  . Transportation needs:    Medical: Not on file    Non-medical: Not on file  Tobacco Use  . Smoking status: Not on file  Substance and Sexual Activity  . Alcohol use: Not on file  . Drug use: Not on file  . Sexual activity: Not on file   Lifestyle  . Physical activity:    Days per week: Not on file    Minutes per session: Not on file  . Stress: Not on file  Relationships  . Social connections:    Talks on phone: Not on file    Gets together: Not on file    Attends religious service: Not on file    Active member of club or organization: Not on file    Attends meetings of clubs or organizations: Not on file    Relationship status: Not on file  . Intimate partner violence:    Fear of current or ex partner: Not on file    Emotionally abused: Not on file    Physically abused: Not on file    Forced sexual activity: Not on file  Other Topics Concern  . Not on file  Social History Narrative  . Not on file    FAMILY HISTORY: Cannot be assessed; secondary to mental status changes No family history on file.  ALLERGIES:  has no allergies on file.  MEDICATIONS:  Current Facility-Administered Medications  Medication Dose Route Frequency Provider Last Rate Last Dose  . 0.9 %  sodium chloride infusion  250 mL Intravenous PRN Pyreddy, [name], MD      . acetaminophen (TYLENOL) tablet 650 mg  650 mg Oral Q6H PRN Pyreddy, [name], MD       Or  . acetaminophen (TYLENOL) suppository 650 mg  650 mg Rectal Q6H PRN [name], MD      . aspirin EC  tablet 81 mg  81 mg Oral Daily Pyreddy, [name], MD      . dextrose 10 % infusion   Intravenous Continuous Eugenie NorrieBlakeney, [name], NP 50 mL/hr at 04/24/17 2033    . dextrose 50 % solution 50 mL  1 ampule Intravenous STAT Conforti, [name], DO      . fentaNYL (SUBLIMAZE) injection 100 mcg  100 mcg Intravenous Q15 min PRN Conforti, [name], DO      . fentaNYL (SUBLIMAZE) injection 100 mcg  100 mcg Intravenous Q2H PRN Conforti, [name], DO      . heparin injection 5,000 Units  5,000 Units Subcutaneous Q8H Ihor AustinPyreddy, [name], MD   5,000 Units at 04/24/17 2117  . HYDROcodone-acetaminophen (NORCO/VICODIN) 5-325 MG per tablet 1-2 tablet  1-2 tablet Oral Q4H PRN Pyreddy, Vivien RotaPavan, MD      . midazolam (VERSED)  injection 2 mg  2 mg Intravenous Q15 min PRN Conforti, [name], DO      . midazolam (VERSED) injection 2 mg  2 mg Intravenous Q2H PRN Conforti, [name], DO      . norepinephrine (LEVOPHED) 16 mg in D5W 250mL premix infusion  0-40 mcg/min Intravenous Titrated Conforti, [name], DO 6.6 mL/hr at 04/24/17 2119 7 mcg/min at 04/24/17 2119  . ondansetron (ZOFRAN) tablet 4 mg  4 mg Oral Q6H PRN Ihor AustinPyreddy, [name], MD       Or  . ondansetron (ZOFRAN) injection 4 mg  4 mg Intravenous Q6H PRN Ihor AustinPyreddy, [name], MD      . Melene Muller[START ON 07/14/2017] piperacillin-tazobactam (ZOSYN) IVPB 3.375 g  3.375 g Intravenous Q12H Pyreddy, [name], MD      . propofol (DIPRIVAN) 1000 MG/100ML infusion  5-80 mcg/kg/min Intravenous Titrated Conforti, [name], DO 10.3 mL/hr at 04/24/17 1825 20 mcg/kg/min at 04/24/17 1825  . senna-docusate (Senokot-S) tablet 1 tablet  1 tablet Oral QHS PRN Pyreddy, [name], MD      . sodium bicarbonate 150 mEq in dextrose 5 % 1,000 mL infusion   Intravenous Continuous Eugenie NorrieBlakeney, [name], NP 75 mL/hr at 04/24/17 2109    . sodium chloride flush (NS) 0.9 % injection 3 mL  3 mL Intravenous Q12H Ihor AustinPyreddy, [name], MD   3 mL at 04/24/17 2116  . sodium chloride flush (NS) 0.9 % injection 3 mL  3 mL Intravenous PRN Pyreddy, Vivien RotaPavan, MD      . Tbo-Filgrastim (GRANIX) injection 480 mcg  480 mcg Subcutaneous Daily [name], [name], MD      . vancomycin (VANCOCIN) IVPB 750 mg/150 ml premix  750 mg Intravenous Q dialysis Pyreddy, Vivien RotaPavan, MD          .  PHYSICAL EXAMINATION:  Vitals:   04/24/17 1915 04/24/17 1949  BP: 103/84   Pulse: 63   Resp: (!) 30   Temp:  (!) 95.9 F (35.5 C)  SpO2: (!) 86%    Filed Weights   04/24/17 1402  Weight: 190 lb (86.2 kg)    GENERAL: Thin cachectic appearing male patient; sedated /intubated no family by the bedside.  On the ventilator EYES: no pallor or icterus OROPHARYNX: n ET tube in place NECK: supple, no masses felt LYMPH:  no palpable lymphadenopathy in the cervical, axillary or  inguinal regions LUNGS: decreased breath sounds to auscultation at bases and  No wheeze or crackles HEART/CVS: regular rate & rhythm and no murmurs; No lower extremity edema ABDOMEN: abdomen soft, non-tender and normal bowel sounds Musculoskeletal:no cyanosis of digits and no clubbing  PSYCH: Sedated NEURO: Cannot be done sedated SKIN: Multiple lower extremity  shallow ulcers noted.  LABORATORY DATA:  I have reviewed the data as listed Lab Results  Component Value Date   WBC 1.8 (L) 08/04/2017   HGB 9.5 (L) 08/01/2017   HCT 31.1 (L) 07/24/2017   MCV 105.9 (H) 07/26/2017   PLT 117 (L) 07/16/2017   Recent Labs    08/03/2017 1323 07/20/2017 1826  NA 138 134*  K 4.5 4.8  CL 96* 96*  CO2 24 14*  GLUCOSE 64* 207*  BUN 82* 74*  CREATININE 6.69* 6.75*  CALCIUM 8.5* 7.6*  GFRNONAA 8* 8*  GFRAA 9* 9*  PROT 6.7 5.4*  ALBUMIN 2.6* 2.0*  AST 52* 68*  ALT 34 34  ALKPHOS 139* 120  BILITOT 4.9* 4.3*    RADIOGRAPHIC STUDIES: I have personally reviewed the radiological images as listed and agreed with the findings in the report. Ct Head Wo Contrast  Result Date: 07/20/2017 CLINICAL DATA:  Altered level of consciousness EXAM: CT HEAD WITHOUT CONTRAST TECHNIQUE: Contiguous axial images were obtained from the base of the skull through the vertex without intravenous contrast. COMPARISON:  None. FINDINGS: Brain: Moderate chronic microvascular disease throughout the deep white matter. Chronic appearing bilateral basal ganglia and thalamic lacunar infarcts. No acute intracranial abnormality. Specifically, no hemorrhage, hydrocephalus, mass lesion, acute infarction, or significant intracranial injury. Vascular: No hyperdense vessel or unexpected calcification. Skull: No acute calvarial abnormality. Sinuses/Orbits: Visualized paranasal sinuses and mastoids clear. Orbital soft tissues unremarkable. Other: None IMPRESSION: Chronic microvascular disease. Chronic appearing bilateral basal ganglia and  thalamic lacunar infarcts. No acute intracranial abnormality. Electronically Signed   By: Charlett Nose M.D.   On: 07/27/2017 14:01   Dg Chest Port 1 View  Result Date: 07/27/2017 CLINICAL DATA:  Intubation. EXAM: PORTABLE CHEST 1 VIEW COMPARISON:  Earlier today 1746 hours. FINDINGS: 1900 hours. Endotracheal tube has been retracted and terminates 2.9 cm above carina. Nasogastric tube looped in the stomach with tip at the proximal stomach. Midline trachea. Cardiomegaly accentuated by AP portable technique. Atherosclerosis in the transverse aorta. No right and no definite left pleural effusion. No pneumothorax. Low lung volumes with resultant pulmonary interstitial prominence. Overall improved aeration since the exam of earlier today. Persistent left base airspace disease and volume loss. IMPRESSION: Retraction of endotracheal tube, appropriately positioned 2.9 cm above carina. Aortic Atherosclerosis (ICD10-I70.0). Improved aeration with persistent left base Airspace disease, likely atelectasis. Electronically Signed   By: Jeronimo Greaves M.D.   On: 07/16/2017 19:24   Dg Chest Port 1 View  Result Date: 07/24/2017 CLINICAL DATA:  62 year old male post endotracheal tube and nasogastric tube placement. Subsequent encounter. EXAM: PORTABLE CHEST 1 VIEW COMPARISON:  07/27/2017 1:14 p.m. chest x-ray. FINDINGS: Patient has been intubated. Tip is in the right mainstem bronchus. This needs to be retracted by 4 cm. Nasogastric tube curled within the gastric fundus. Cardiomegaly. Crowding lung markings versus atelectasis left lung base. Suspect coronary artery calcification. Calcified slightly tortuous aorta. IMPRESSION: Patient has been intubated. Tip is in the right mainstem bronchus. This needs to be retracted by 4 cm. Nasogastric tube curled within the gastric fundus. Cardiomegaly. Crowding lung markings versus atelectasis left lung base. Suspect coronary artery calcification. Aortic Atherosclerosis (ICD10-I70.0). These  results were called by telephone at the time of interpretation on 07/27/2017 at 6:43 pm to [place], patients nurse, who verbally acknowledged these results. Electronically Signed   By: Lacy Duverney M.D.   On: 07/29/2017 18:49   Dg Chest Port 1 View  Result Date: 07/31/2017 CLINICAL DATA:  Shortness of  breath. Altered mental status EXAM: PORTABLE CHEST 1 VIEW COMPARISON:  None. FINDINGS: Cardiomegaly and coronary stenting. Aortic atherosclerosis. Low lung volumes. There is no edema, consolidation, effusion, or pneumothorax. IMPRESSION: No evidence of active disease. Cardiomegaly. Electronically Signed   By: Marnee Spring M.D.   On: 07/27/2017 13:47    ASSESSMENT & PLAN:   #62 year old male patient end-stage renal disease on dialysis; history of strokes; macrocytic anemia currently admitted to the hospital altered mental status/syncope  # Severe leukopenia white count 1.8; ANC 800-new-likely secondary to infection/sepsis.  Given the septic picture-I would recommend starting the patient on Granix daily.   #Macrocytic anemia-chronic unclear etiology [question related to erythropoietin/CKD].  Check B12 folic acid reticulocyte count.  LDH.  Currently hemoglobin stable on 9.5.  #Thrombocytopenia-117 asymptomatic; continue.  Likely related to sepsis/infectious etiology. Check DIC panel.   #Acute altered mental status/acute respiratory failure-status post intubation/ventilation.  Question sepsis versus others.  On antibiotics managed by ICU team  Thank you Dr. Tobi Bastos for allowing me to participate in the care of your pleasant patient. Please do not hesitate to contact me with questions or concerns in the interim.    Earna Coder, MD 08/08/2017 9:35 PM

## 2017-07-13 NOTE — ED Triage Notes (Signed)
Pt arrives via [facility] from dialysis, whom states pt was 1.5 hour into tx when pt went unconscious. Pt has hx of CVA UNT, pt has r/s paralysis and dysphagia as a result. Pt is responding and alert at this time. Pt presents with bi lateral untx leg wounds. Pt lives at home un if pt has care giver or not.

## 2017-07-13 NOTE — Progress Notes (Signed)
Glucose rechecked and is 58.  RN made Dr. Lonn Georgiaonforti aware and MD gave order for Amp of d50

## 2017-07-13 NOTE — Progress Notes (Signed)
CODE SEPSIS - PHARMACY COMMUNICATION  **Broad Spectrum Antibiotics should be administered within 1 hour of Sepsis diagnosis**  Time Code Sepsis Called/Page Received: 1327  Antibiotics Ordered: vancomycin and Zosyn  Time of 1st antibiotic administration: 1330  Additional action taken by pharmacy: none required  If necessary, Name of Provider/Nurse Contacted: N/A    Lowella Bandyodney D Jin Capote ,PharmD Clinical Pharmacist  07/31/2017  2:04 PM

## 2017-07-13 NOTE — Progress Notes (Signed)
Patient ID: Marcus LittenReginald Romero, male   DOB: 11/25/1955, 62 y.o.   MRN: [id number] Pulmonary/critical care  Procedure note Central line placement Indication: Postintubation, inadequate access, hypotension Right femoral location chosen, patient with fistula left arm, EJ and right neck Performed under ultrasound guidance Complete contact precautions utilized Hibiclens to clean the groin area Under direct ultrasound visualization the right femoral vein was cannulated without difficulty Guidewire was placed and the needle was removed Using Seldinger technique a triple lumen catheter was placed without difficulty All 3 ports flushed, had dark nonpulsatile venous return Sutured into place Dressed by nurse  Tora KindredJohn Ghina Bittinger, D.O.

## 2017-07-13 NOTE — ED Notes (Signed)
Date and time results received: 07/12/2017 1600   Test: Lac Critical Value: 7.4  Name of Provider Notified: Schaevitz

## 2017-07-13 NOTE — ED Notes (Signed)
This RN attempted report, RN will give floor 15 mins then bedside report.

## 2017-07-13 NOTE — ED Notes (Signed)
This RN received information that pt sister Marlyne Beardsva Banh has called.   Return number is [phone].  [place] RN in ICU aware of sister calling. Report given, RN will sign off now.

## 2017-07-13 NOTE — Consult Note (Addendum)
Reason for Consult:Syncope, AMS Referring Physician: Pyreddy  CC: Syncope, AMS  HPI: Marcus LittenReginald Romero is an 62 y.o. male with a history of ESRD who today at dialysis had a syncopal episode.  Has a history of stroke with a residual right hemiparesis but unclear baseline.  Patient now improving but unable to provide any history.  No family available.  Past Medical History:  Diagnosis Date  . CVA (cerebral vascular accident) (HCC)   . ESRD on dialysis (HCC)   . Hypertension     History reviewed. No pertinent surgical history.  Family history: Mother with HTN.  Brothers with asthma, DM, CAD, kidney disease  Social History:  Former smoker  Not on File  Medications:  I have reviewed the patient's current medications. Prior to Admission:  Medications Prior to Admission  Medication Sig Dispense Refill Last Dose  . carvedilol (COREG) 25 MG tablet Take 25 mg by mouth 2 (two) times daily.     . insulin NPH Human (HUMULIN N) 100 UNIT/ML injection Inject 15 Units into the skin 2 (two) times daily.     Marland Kitchen. lisinopril (PRINIVIL,ZESTRIL) 40 MG tablet Take 40 mg by mouth daily.  3   . loperamide (IMODIUM) 2 MG capsule Take 2 mg by mouth 3 (three) times daily as needed.  0    Scheduled: . aspirin EC  81 mg Oral Daily  . dextrose  1 ampule Intravenous STAT  . heparin  5,000 Units Subcutaneous Q8H  . sodium chloride flush  3 mL Intravenous Q12H    ROS: Unable to provide due to mental status  Physical Examination: Blood pressure (!) 72/52, pulse 62, temperature 98.2 F (36.8 C), temperature source Axillary, resp. rate (!) 30, height 5\' 2"  (1.575 m), weight 86.2 kg (190 lb), SpO2 98 %.  HEENT-  Normocephalic, no lesions, without obvious abnormality.  Normal external eye and conjunctiva.  Normal TM's bilaterally.  Normal auditory canals and external ears. Normal external nose, mucus membranes and septum.  Normal pharynx. Cardiovascular- S1, S2 normal, pulses palpable throughout   Lungs-  decreased breath sounds Abdomen- soft, non-tender; bowel sounds normal; no masses,  no organomegaly Extremities- LE edema Lymph-no adenopathy palpable Musculoskeletal-no joint tenderness, deformity or swelling Skin-open wounds on abdomen and both legs  Neurological Examination   Mental Status: Speech slurred.  Answers some questions with "yes" or "no" but no other words expressed.  Does not follow commands Cranial Nerves: II: patient does not respond confrontation bilaterally III,IV,VI: EOMIs grossly. V,VII: no facial weakness noted  VIII: patient responds to verbal stimuli IX,X: gag reflex unable to teset,  XI: trapezius strength unable to test bilaterally XII: tongue strength unable to test Motor: Increased tone on the right with pain on active movement.  Normal tone on the left Sensory: Appreciates stimulation bilaterally Deep Tendon Reflexes:  1+ in the upper extremities and absent in the lower extremities Plantars: Mute bilaterally Cerebellar: Unable to perform    Laboratory Studies:   Basic Metabolic Panel: Recent Labs  Lab 08/03/2017 1323  NA 138  K 4.5  CL 96*  CO2 24  GLUCOSE 64*  BUN 82*  CREATININE 6.69*  CALCIUM 8.5*    Liver Function Tests: Recent Labs  Lab 07/20/2017 1323  AST 52*  ALT 34  ALKPHOS 139*  BILITOT 4.9*  PROT 6.7  ALBUMIN 2.6*   No results for input(s): LIPASE, AMYLASE in the last 168 hours. No results for input(s): AMMONIA in the last 168 hours.  CBC: Recent Labs  Lab 07/30/2017  1323 2017/08/04 1826  WBC 1.0* 1.8*  NEUTROABS 0.8*  --   HGB 10.7* 9.5*  HCT 32.8* 31.1*  MCV 100.6* 105.9*  PLT 141* 117*    Cardiac Enzymes: Recent Labs  Lab 08-04-17 1323  TROPONINI 2.07*    BNP: Invalid input(s): POCBNP  CBG: Recent Labs  Lab 08/04/17 1722 August 04, 2017 1726 August 04, 2017 1758 04-Aug-2017 1831  GLUCAP <10* <10* 58* 105*    Microbiology: No results found for this or any previous visit.  Coagulation Studies: Recent Labs     08/04/2017 1323  LABPROT 19.4*  INR 1.65    Urinalysis: No results for input(s): COLORURINE, LABSPEC, PHURINE, GLUCOSEU, HGBUR, BILIRUBINUR, KETONESUR, PROTEINUR, UROBILINOGEN, NITRITE, LEUKOCYTESUR in the last 168 hours.  Invalid input(s): APPERANCEUR  Lipid Panel:     Component Value Date/Time   TRIG 168 (H) Aug 04, 2017 1702    HgbA1C: No results found for: HGBA1C  Urine Drug Screen:  No results found for: LABOPIA, COCAINSCRNUR, LABBENZ, AMPHETMU, THCU, LABBARB  Alcohol Level: No results for input(s): ETH in the last 168 hours.  Other results: EKG: sinus rhythm at 90 bpm, LBBB.  Imaging: Ct Head Wo Contrast  Result Date: Aug 04, 2017 CLINICAL DATA:  Altered level of consciousness EXAM: CT HEAD WITHOUT CONTRAST TECHNIQUE: Contiguous axial images were obtained from the base of the skull through the vertex without intravenous contrast. COMPARISON:  None. FINDINGS: Brain: Moderate chronic microvascular disease throughout the deep white matter. Chronic appearing bilateral basal ganglia and thalamic lacunar infarcts. No acute intracranial abnormality. Specifically, no hemorrhage, hydrocephalus, mass lesion, acute infarction, or significant intracranial injury. Vascular: No hyperdense vessel or unexpected calcification. Skull: No acute calvarial abnormality. Sinuses/Orbits: Visualized paranasal sinuses and mastoids clear. Orbital soft tissues unremarkable. Other: None IMPRESSION: Chronic microvascular disease. Chronic appearing bilateral basal ganglia and thalamic lacunar infarcts. No acute intracranial abnormality. Electronically Signed   By: Charlett Nose M.D.   On: August 04, 2017 14:01   Dg Chest Port 1 View  Result Date: August 04, 2017 CLINICAL DATA:  62 year old male post endotracheal tube and nasogastric tube placement. Subsequent encounter. EXAM: PORTABLE CHEST 1 VIEW COMPARISON:  2017-08-04 1:14 p.m. chest x-ray. FINDINGS: Patient has been intubated. Tip is in the right mainstem bronchus. This  needs to be retracted by 4 cm. Nasogastric tube curled within the gastric fundus. Cardiomegaly. Crowding lung markings versus atelectasis left lung base. Suspect coronary artery calcification. Calcified slightly tortuous aorta. IMPRESSION: Patient has been intubated. Tip is in the right mainstem bronchus. This needs to be retracted by 4 cm. Nasogastric tube curled within the gastric fundus. Cardiomegaly. Crowding lung markings versus atelectasis left lung base. Suspect coronary artery calcification. Aortic Atherosclerosis (ICD10-I70.0). These results were called by telephone at the time of interpretation on 08-04-17 at 6:43 pm to [place], patients nurse, who verbally acknowledged these results. Electronically Signed   By: Lacy Duverney M.D.   On: Aug 04, 2017 18:49   Dg Chest Port 1 View  Result Date: August 04, 2017 CLINICAL DATA:  Shortness of breath. Altered mental status EXAM: PORTABLE CHEST 1 VIEW COMPARISON:  None. FINDINGS: Cardiomegaly and coronary stenting. Aortic atherosclerosis. Low lung volumes. There is no edema, consolidation, effusion, or pneumothorax. IMPRESSION: No evidence of active disease. Cardiomegaly. Electronically Signed   By: Marnee Spring M.D.   On: August 04, 2017 13:47     Assessment/Plan: 62 year old male presenting after a syncopal episode.  Now appears altered.  Unclear baseline.  Head CT reviewed and shows no acute changes.  Metabolic abnormalities noted as well as open wounds which could  be contributing to mental status.  Further work up recommended.    Recommendations: 1. MRI of the brain without contrast 2. Agree with continued medical work up 3. EEG  Thana Farr, MD Neurology [phone] 07/12/2017, 7:22 PM

## 2017-07-13 NOTE — Progress Notes (Signed)
Dextrose given IV  For LO blood glucose per CBG monitor.  Will reassess.

## 2017-07-13 NOTE — ED Notes (Signed)
Labs sent at this time.

## 2017-07-13 NOTE — Progress Notes (Signed)
Patient intubated by Dr. Lonn Georgiaonforti after arriving from ED.  Patient has gag and cough and RR in 30's therefore after speaking to Dr. Lonn Georgiaonforti propofol was started.  Patient has not responded to stimulation yet.  Sinus rhythm with PVCs.  Levophed started to support blood pressure.

## 2017-07-14 ENCOUNTER — Inpatient Hospital Stay: Payer: Self-pay

## 2017-07-14 ENCOUNTER — Inpatient Hospital Stay (HOSPITAL_COMMUNITY)
Admit: 2017-07-14 | Discharge: 2017-07-14 | Disposition: A | Discharge status: Home / Self Care | Payer: Self-pay | Attending: Internal Medicine | Admitting: Internal Medicine

## 2017-07-14 DIAGNOSIS — I361 Nonrheumatic tricuspid (valve) insufficiency: Secondary | ICD-10-CM

## 2017-07-14 DIAGNOSIS — J9601 Acute respiratory failure with hypoxia: Secondary | ICD-10-CM

## 2017-07-14 DIAGNOSIS — R4182 Altered mental status, unspecified: Secondary | ICD-10-CM

## 2017-07-14 LAB — CBC WITH DIFFERENTIAL/PLATELET
BAND NEUTROPHILS: 18 %
BASOS PCT: 0 %
Basophils Absolute: 0 10*3/uL (ref 0–0.1)
Blasts: 0 %
EOS PCT: 16 %
Eosinophils Absolute: 0.7 10*3/uL (ref 0–0.7)
HCT: 31.2 % — ABNORMAL LOW (ref 40.0–52.0)
Hemoglobin: 9.8 g/dL — ABNORMAL LOW (ref 13.0–18.0)
LYMPHS ABS: 0.5 10*3/uL — AB (ref 1.0–3.6)
LYMPHS PCT: 11 %
MCH: 32.1 pg (ref 26.0–34.0)
MCHC: 31.3 g/dL — ABNORMAL LOW (ref 32.0–36.0)
MCV: 102.6 fL — AB (ref 80.0–100.0)
MONO ABS: 0.6 10*3/uL (ref 0.2–1.0)
Metamyelocytes Relative: 11 %
Monocytes Relative: 15 %
Myelocytes: 2 %
NEUTROS ABS: 2.5 10*3/uL (ref 1.4–6.5)
NEUTROS PCT: 27 %
NRBC: 8 /100{WBCs} — AB
OTHER: 0 %
PLATELETS: 75 10*3/uL — AB (ref 150–440)
Promyelocytes Relative: 0 %
RBC: 3.04 MIL/uL — ABNORMAL LOW (ref 4.40–5.90)
RDW: 20.2 % — AB (ref 11.5–14.5)
WBC: 4.3 10*3/uL (ref 3.8–10.6)

## 2017-07-14 LAB — PROCALCITONIN: Procalcitonin: 85.23 ng/mL

## 2017-07-14 LAB — GLUCOSE, CAPILLARY
GLUCOSE-CAPILLARY: 108 mg/dL — AB (ref 65–99)
GLUCOSE-CAPILLARY: 130 mg/dL — AB (ref 65–99)
GLUCOSE-CAPILLARY: 178 mg/dL — AB (ref 65–99)
Glucose-Capillary: 65 mg/dL (ref 65–99)
Glucose-Capillary: 142 mg/dL — ABNORMAL HIGH (ref 65–99)
Glucose-Capillary: 120 mg/dL — ABNORMAL HIGH (ref 65–99)
Glucose-Capillary: 115 mg/dL — ABNORMAL HIGH (ref 65–99)
Glucose-Capillary: 143 mg/dL — ABNORMAL HIGH (ref 65–99)

## 2017-07-14 LAB — BLOOD CULTURE ID PANEL (REFLEXED)
Acinetobacter baumannii: NOT DETECTED
CANDIDA KRUSEI: NOT DETECTED
CARBAPENEM RESISTANCE: NOT DETECTED
Candida albicans: NOT DETECTED
Candida glabrata: NOT DETECTED
Candida parapsilosis: NOT DETECTED
Candida tropicalis: NOT DETECTED
ENTEROBACTER CLOACAE COMPLEX: NOT DETECTED
ENTEROCOCCUS SPECIES: NOT DETECTED
ESCHERICHIA COLI: NOT DETECTED
Enterobacteriaceae species: DETECTED — AB
Haemophilus influenzae: NOT DETECTED
KLEBSIELLA OXYTOCA: NOT DETECTED
Klebsiella pneumoniae: NOT DETECTED
LISTERIA MONOCYTOGENES: NOT DETECTED
Neisseria meningitidis: NOT DETECTED
PSEUDOMONAS AERUGINOSA: NOT DETECTED
Proteus species: NOT DETECTED
STAPHYLOCOCCUS AUREUS BCID: NOT DETECTED
STREPTOCOCCUS PYOGENES: NOT DETECTED
STREPTOCOCCUS SPECIES: NOT DETECTED
Serratia marcescens: DETECTED — AB
Staphylococcus species: NOT DETECTED
Streptococcus agalactiae: NOT DETECTED
Streptococcus pneumoniae: NOT DETECTED

## 2017-07-14 LAB — BLOOD GAS, ARTERIAL
ACID-BASE DEFICIT: 8.9 mmol/L — AB (ref 0.0–2.0)
Bicarbonate: 15.3 mmol/L — ABNORMAL LOW (ref 20.0–28.0)
FIO2: 0.3
LHR: 16 {breaths}/min
O2 Saturation: 98.2 %
PCO2 ART: 27 mmHg — AB (ref 32.0–48.0)
PEEP: 5 cmH2O
Patient temperature: 37
VT: 500 mL
pH, Arterial: 7.36 (ref 7.350–7.450)
pO2, Arterial: 113 mmHg — ABNORMAL HIGH (ref 83.0–108.0)

## 2017-07-14 LAB — BASIC METABOLIC PANEL
Anion gap: 32 — ABNORMAL HIGH (ref 5–15)
BUN: 81 mg/dL — AB (ref 6–20)
CALCIUM: 7.5 mg/dL — AB (ref 8.9–10.3)
CO2: 16 mmol/L — AB (ref 22–32)
CREATININE: 7.07 mg/dL — AB (ref 0.61–1.24)
Chloride: 92 mmol/L — ABNORMAL LOW (ref 101–111)
GFR calc Af Amer: 8 mL/min — ABNORMAL LOW (ref >60)
GFR calc non Af Amer: 7 mL/min — ABNORMAL LOW (ref >60)
GLUCOSE: 161 mg/dL — AB (ref 65–99)
Potassium: 4.7 mmol/L (ref 3.5–5.1)
Sodium: 140 mmol/L (ref 135–145)

## 2017-07-14 LAB — ECHOCARDIOGRAM COMPLETE
Height: 62 in
WEIGHTICAEL: 3040 [oz_av]

## 2017-07-14 LAB — APTT: aPTT: 38 seconds — ABNORMAL HIGH (ref 24–36)

## 2017-07-14 LAB — MAGNESIUM: Magnesium: 2 mg/dL (ref 1.7–2.4)

## 2017-07-14 LAB — FIBRINOGEN: FIBRINOGEN: 522 mg/dL — AB (ref 210–475)

## 2017-07-14 LAB — PROTIME-INR
INR: 2.68
Prothrombin Time: 28.3 seconds — ABNORMAL HIGH (ref 11.4–15.2)

## 2017-07-14 LAB — TROPONIN I
TROPONIN I: 2.2 ng/mL — AB (ref <0.03)
TROPONIN I: 2.96 ng/mL — AB (ref <0.03)
Troponin I: 3.73 ng/mL (ref <0.03)
Troponin I: 3.97 ng/mL (ref <0.03)

## 2017-07-14 LAB — POCT I-STAT CREATININE: CREATININE: 1 mg/dL (ref 0.61–1.24)

## 2017-07-14 LAB — LACTIC ACID, PLASMA: LACTIC ACID, VENOUS: 13 mmol/L — AB (ref 0.5–1.9)

## 2017-07-14 LAB — VITAMIN B12: Vitamin B-12: 1219 pg/mL — ABNORMAL HIGH (ref 180–914)

## 2017-07-14 LAB — ABO/RH: ABO/RH(D): B POS

## 2017-07-14 LAB — PHOSPHORUS: Phosphorus: 7.8 mg/dL — ABNORMAL HIGH (ref 2.5–4.6)

## 2017-07-14 MED ORDER — DEXTROSE 50 % IV SOLN
25.0000 mL | Freq: Once | INTRAVENOUS | Status: AC
  Administered 2017-07-14: 25 mL via INTRAVENOUS
  Filled 2017-07-14: qty 50

## 2017-07-14 MED ORDER — FAMOTIDINE 20 MG PO TABS: 20.0000 mg | ORAL_TABLET | Freq: Two times a day (BID) | ORAL | Status: DC

## 2017-07-14 MED ORDER — SODIUM CHLORIDE 0.9 % IV SOLN
Freq: Once | INTRAVENOUS | Status: AC
  Administered 2017-07-14: 12:00:00 via INTRAVENOUS

## 2017-07-14 MED ORDER — SODIUM BICARBONATE 8.4 % IV SOLN
INTRAVENOUS | Status: DC
  Administered 2017-07-14 – 2017-07-15 (×2): via INTRAVENOUS
  Filled 2017-07-14 (×2): qty 150

## 2017-07-14 MED ORDER — WHITE PETROLATUM EX OINT
TOPICAL_OINTMENT | Freq: Every day | CUTANEOUS | Status: DC
  Administered 2017-07-14: 15:00:00 via TOPICAL
  Filled 2017-07-14 (×2): qty 5

## 2017-07-14 MED ORDER — ORAL CARE MOUTH RINSE
15.0000 mL | OROMUCOSAL | Status: DC
  Administered 2017-07-14 – 2017-07-15 (×13): 15 mL via OROMUCOSAL

## 2017-07-14 MED ORDER — NOREPINEPHRINE BITARTRATE 1 MG/ML IV SOLN
0.0000 ug/min | INTRAVENOUS | Status: DC
  Administered 2017-07-14 (×2): 17 ug/min via INTRAVENOUS
  Filled 2017-07-14 (×2): qty 16

## 2017-07-14 MED ORDER — PERFLUTREN LIPID MICROSPHERE
1.0000 mL | INTRAVENOUS | Status: AC | PRN
  Administered 2017-07-14: 2 mL via INTRAVENOUS
  Filled 2017-07-14: qty 10

## 2017-07-14 MED ORDER — FAMOTIDINE 20 MG PO TABS
20.0000 mg | ORAL_TABLET | Freq: Every day | ORAL | Status: DC
  Administered 2017-07-14 – 2017-07-15 (×2): 20 mg
  Filled 2017-07-14 (×2): qty 1

## 2017-07-14 MED ORDER — HYDROCORTISONE NA SUCCINATE PF 100 MG IJ SOLR
100.0000 mg | Freq: Three times a day (TID) | INTRAMUSCULAR | Status: DC
  Administered 2017-07-14 – 2017-07-15 (×4): 100 mg via INTRAVENOUS
  Filled 2017-07-14 (×4): qty 2

## 2017-07-14 MED ORDER — CHLORHEXIDINE GLUCONATE 0.12% ORAL RINSE (MEDLINE KIT)
15.0000 mL | Freq: Two times a day (BID) | OROMUCOSAL | Status: DC
  Administered 2017-07-14 – 2017-07-15 (×4): 15 mL via OROMUCOSAL

## 2017-07-14 NOTE — Progress Notes (Signed)
Pharmacy Electrolyte Monitoring Consult:  Pharmacy consulted to assist in monitoring and replacing electrolytes in this 62 y.o. male admitted on 07/30/2017 with Loss of Consciousness  Patient currently intubated and sedated with propofol.   Bicarb infusion at 6575mL/hr.   Labs:  Sodium (mmol/L)  Date Value  07/14/2017 140   Potassium (mmol/L)  Date Value  07/14/2017 4.7   Magnesium (mg/dL)  Date Value  16/10/960406/05/2017 2.0   Phosphorus (mg/dL)  Date Value  54/09/811906/05/2017 7.8 (H)   Calcium (mg/dL)  Date Value  14/78/295606/05/2017 7.5 (L)   Albumin (g/dL)  Date Value  21/30/865706/04/2017 2.0 (L)    Assessment/Plan: No replacement warranted. Patient is candidate for CRRT. Will defer replacement to nephrology. Will maintain consult for monitoring purposes.   Will recheck electrolytes with am labs.   Pharmacy will continue to monitor and adjust per consult.   Marcus Romero L 07/14/2017 7:24 PM

## 2017-07-14 NOTE — Consult Note (Signed)
[facility]  CONSULT NOTE    Date: 07/14/2017                  Patient Name:  Keyion Knack  MRN: [id number]  DOB: 1955-03-01  Age / Sex: 62 y.o., male         PCP: Patient, No Pcp Per                 Service Requesting Consult: Dr. Jefferson Fuel                 Reason for Consult: End Stage Renal Disease            History of Present Illness: Mr. Keylin Podolsky is a 62 y.o. black male with end stage renal disease on hemodialysis, diabetes mellitus type II insulin dependent, hypertension, anemia, hyperlipidemia, BPH, CVA with residual right sided weakness, coronary artery disease, who was admitted to [facility] on 07/25/2017 for Sepsis, due to unspecified organism ([place]) [A41.9] Altered mental status, unspecified altered mental status type [R41.82] Leukopenia, unspecified type [D72.819] Sepsis ([place]) [A41.9]  Daughter at bedside.   Patient got 2.5 hours of hemodialysis yesterday when he had a syncopal episode. Patient then was admitted where is became more lethargic and required intubation for protection of airway. Patient currently with metabolic acidosis, sepsis and pancytopenia. Empiric zosyn and placed on norepinephrine. Granix for his leukocytosis.   Medications: Outpatient medications: Medications Prior to Admission  Medication Sig Dispense Refill Last Dose  . carvedilol (COREG) 25 MG tablet Take 25 mg by mouth 2 (two) times daily.     . insulin NPH Human (HUMULIN N) 100 UNIT/ML injection Inject 15 Units into the skin 2 (two) times daily.     Marland Kitchen lisinopril (PRINIVIL,ZESTRIL) 40 MG tablet Take 40 mg by mouth daily.  3   . loperamide (IMODIUM) 2 MG capsule Take 2 mg by mouth 3 (three) times daily as needed.  0     Current medications: Current Facility-Administered Medications  Medication Dose Route Frequency Provider Last Rate Last Dose  . 0.9 %  sodium chloride infusion  250 mL Intravenous PRN Pyreddy, Reatha Harps, MD      . acetaminophen (TYLENOL) tablet 650 mg  650  mg Oral Q6H PRN Pyreddy, Reatha Harps, MD       Or  . acetaminophen (TYLENOL) suppository 650 mg  650 mg Rectal Q6H PRN Saundra Shelling, MD      . aspirin EC tablet 81 mg  81 mg Oral Daily Pyreddy, [name], MD      . chlorhexidine gluconate (MEDLINE KIT) (PERIDEX) 0.12 % solution 15 mL  15 mL Mouth Rinse BID Awilda Bill, NP   15 mL at 07/14/17 0837  . dextrose 10 % infusion   Intravenous Continuous Awilda Bill, NP 50 mL/hr at 07/14/17 0600    . dextrose 50 % solution 50 mL  1 ampule Intravenous STAT Conforti, [name], DO      . fentaNYL (SUBLIMAZE) injection 100 mcg  100 mcg Intravenous Q15 min PRN Conforti, [name], DO      . fentaNYL (SUBLIMAZE) injection 100 mcg  100 mcg Intravenous Q2H PRN Conforti, [name], DO   100 mcg at 07/14/17 0150  . heparin injection 5,000 Units  5,000 Units Subcutaneous Q8H Saundra Shelling, MD   5,000 Units at 07/14/17 0525  . HYDROcodone-acetaminophen (NORCO/VICODIN) 5-325 MG per tablet 1-2 tablet  1-2 tablet Oral Q4H PRN Saundra Shelling, MD      . MEDLINE mouth rinse  15 mL  Mouth Rinse 10 times per day Awilda Bill, NP   15 mL at 07/14/17 0526  . midazolam (VERSED) injection 2 mg  2 mg Intravenous Q15 min PRN Conforti, [name], DO      . midazolam (VERSED) injection 2 mg  2 mg Intravenous Q2H PRN Conforti, [name], DO      . norepinephrine (LEVOPHED) 16 mg in dextrose 5 % 250 mL (0.064 mg/mL) infusion  0-40 mcg/min Intravenous Titrated Bettey Costa, MD 17.8 mL/hr at 07/14/17 0836 19 mcg/min at 07/14/17 0836  . ondansetron (ZOFRAN) tablet 4 mg  4 mg Oral Q6H PRN Pyreddy, Reatha Harps, MD       Or  . ondansetron (ZOFRAN) injection 4 mg  4 mg Intravenous Q6H PRN Pyreddy, [name], MD      . piperacillin-tazobactam (ZOSYN) IVPB 3.375 g  3.375 g Intravenous Q12H Saundra Shelling, MD   Stopped at 07/14/17 0545  . propofol (DIPRIVAN) 1000 MG/100ML infusion  5-80 mcg/kg/min Intravenous Titrated Conforti, [name], DO 10.3 mL/hr at 07/14/17 0600 19.915 mcg/kg/min at 07/14/17 0600  . senna-docusate  (Senokot-S) tablet 1 tablet  1 tablet Oral QHS PRN Pyreddy, [name], MD      . sodium bicarbonate 150 mEq in dextrose 5 % 1,000 mL infusion   Intravenous Continuous Awilda Bill, NP 75 mL/hr at 07/14/17 0600    . sodium chloride flush (NS) 0.9 % injection 3 mL  3 mL Intravenous Q12H Saundra Shelling, MD   3 mL at 07/23/2017 2116  . sodium chloride flush (NS) 0.9 % injection 3 mL  3 mL Intravenous PRN Pyreddy, Reatha Harps, MD      . Tbo-Filgrastim Galen Daft) injection 480 mcg  480 mcg Subcutaneous Daily Cammie Sickle, MD   480 mcg at 08/01/2017 2220  . vancomycin (VANCOCIN) IVPB 750 mg/150 ml premix  750 mg Intravenous Q dialysis Pyreddy, Reatha Harps, MD          Allergies: Not on File    Past Medical History: Past Medical History:  Diagnosis Date  . CVA (cerebral vascular accident) ([place])   . ESRD on dialysis ([place])   . Hypertension      Past Surgical History: History reviewed. No pertinent surgical history.   Family History: No family history on file.   Social History: Social History   Socioeconomic History  . Marital status: Married    Spouse name: Not on file  . Number of children: Not on file  . Years of education: Not on file  . Highest education level: Not on file  Occupational History  . Not on file  Social Needs  . Financial resource strain: Not on file  . Food insecurity:    Worry: Not on file    Inability: Not on file  . Transportation needs:    Medical: Not on file    Non-medical: Not on file  Tobacco Use  . Smoking status: Not on file  Substance and Sexual Activity  . Alcohol use: Not on file  . Drug use: Not on file  . Sexual activity: Not on file  Lifestyle  . Physical activity:    Days per week: Not on file    Minutes per session: Not on file  . Stress: Not on file  Relationships  . Social connections:    Talks on phone: Not on file    Gets together: Not on file    Attends religious service: Not on file    Active member of club or organization: Not  on file  Attends meetings of clubs or organizations: Not on file    Relationship status: Not on file  . Intimate partner violence:    Fear of current or ex partner: Not on file    Emotionally abused: Not on file    Physically abused: Not on file    Forced sexual activity: Not on file  Other Topics Concern  . Not on file  Social History Narrative  . Not on file     Review of Systems: Review of Systems  Unable to perform ROS: Critical illness    Vital Signs: Blood pressure 96/67, pulse 83, temperature 98.6 F (37 C), temperature source Oral, resp. rate (!) 27, height '5\' 2"'$  (1.575 m), weight 86.2 kg (190 lb), SpO2 97 %.  Weight trends: Filed Weights   08/08/2017 1402  Weight: 86.2 kg (190 lb)    Physical Exam: General: Critically Ill  Head: ETT, OGT  Eyes: Pupils not reactive to light  Neck: trachea midline  Lungs:  Mechanical ventilation PRVC 30%  Heart: Regular rate and rhythm  Abdomen:  Soft, nontender  Extremities: Bullous edema, chronic bilateral lower extremity ulcers  Neurologic: Intubated, and sedated  Skin: Multiple bilateral lower extremity diabetic skin ulcers  Access: Left AVF     Lab results: Basic Metabolic Panel: Recent Labs  Lab 07/21/2017 1323 07/18/2017 1826 07/14/17 0353  NA 138 134* 140  K 4.5 4.8 4.7  CL 96* 96* 92*  CO2 24 14* 16*  GLUCOSE 64* 207* 161*  BUN 82* 74* 81*  CREATININE 6.69* 6.75* 7.07*  CALCIUM 8.5* 7.6* 7.5*  MG  --   --  2.0  PHOS  --   --  7.8*    Liver Function Tests: Recent Labs  Lab 08/05/2017 1323 07/20/2017 1826  AST 52* 68*  ALT 34 34  ALKPHOS 139* 120  BILITOT 4.9* 4.3*  PROT 6.7 5.4*  ALBUMIN 2.6* 2.0*   No results for input(s): LIPASE, AMYLASE in the last 168 hours. No results for input(s): AMMONIA in the last 168 hours.  CBC: Recent Labs  Lab 07/25/2017 1323 07/22/2017 1826  WBC 1.0* 1.8*  NEUTROABS 0.8*  --   HGB 10.7* 9.5*  HCT 32.8* 31.1*  MCV 100.6* 105.9*  PLT 141* 117*    Cardiac  Enzymes: Recent Labs  Lab 07/17/2017 1323 07/12/2017 1826 07/12/2017 1929 07/14/17 0353  TROPONINI 2.07* 1.81* 1.92* 2.20*    BNP: Invalid input(s): POCBNP  CBG: Recent Labs  Lab 07/12/2017 1944 07/14/17 0020 07/14/17 0121 07/14/17 0330 07/14/17 0819  GLUCAP 95 65 108* 130* 142*    Microbiology: Results for orders placed or performed during the hospital encounter of 07/14/2017  Culture, blood (Routine x 2)     Status: None (Preliminary result)   Collection Time: 07/31/2017  1:23 PM  Result Value Ref Range Status   Specimen Description BLOOD BLOOD RIGHT FOREARM  Final   Special Requests   Final    BOTTLES DRAWN AEROBIC AND ANAEROBIC Blood Culture adequate volume   Culture  Setup Time   Final    Organism ID to follow GRAM NEGATIVE RODS IN BOTH AEROBIC AND ANAEROBIC BOTTLES CRITICAL RESULT CALLED TO, READ BACK BY AND VERIFIED WITH: [place] ON 07/14/17 AT 0026 JAG Performed at [facility], [address] [place], [place] [address]    Culture GRAM NEGATIVE RODS  Final   Report Status PENDING  Incomplete  Blood Culture ID Panel (Reflexed)     Status: Abnormal   Collection Time: 07/23/2017  1:23 PM  Result Value Ref Range Status   Enterococcus species NOT DETECTED NOT DETECTED Final   Listeria monocytogenes NOT DETECTED NOT DETECTED Final   Staphylococcus species NOT DETECTED NOT DETECTED Final   Staphylococcus aureus NOT DETECTED NOT DETECTED Final   Streptococcus species NOT DETECTED NOT DETECTED Final   Streptococcus agalactiae NOT DETECTED NOT DETECTED Final   Streptococcus pneumoniae NOT DETECTED NOT DETECTED Final   Streptococcus pyogenes NOT DETECTED NOT DETECTED Final   Acinetobacter baumannii NOT DETECTED NOT DETECTED Final   Enterobacteriaceae species DETECTED (A) NOT DETECTED Final    Comment: Enterobacteriaceae represent a large family of gram-negative bacteria, not a single organism. CRITICAL RESULT CALLED TO, READ BACK BY AND VERIFIED WITH: DAVID  BESANTI ON 07/14/17 AT 0026 JAG    Enterobacter cloacae complex NOT DETECTED NOT DETECTED Final   Escherichia coli NOT DETECTED NOT DETECTED Final   Klebsiella oxytoca NOT DETECTED NOT DETECTED Final   Klebsiella pneumoniae NOT DETECTED NOT DETECTED Final   Proteus species NOT DETECTED NOT DETECTED Final   Serratia marcescens DETECTED (A) NOT DETECTED Final    Comment: CRITICAL RESULT CALLED TO, READ BACK BY AND VERIFIED WITH: DAVID BESANTI ON 07/14/17 AT 0026 JAG    Carbapenem resistance NOT DETECTED NOT DETECTED Final   Haemophilus influenzae NOT DETECTED NOT DETECTED Final   Neisseria meningitidis NOT DETECTED NOT DETECTED Final   Pseudomonas aeruginosa NOT DETECTED NOT DETECTED Final   Candida albicans NOT DETECTED NOT DETECTED Final   Candida glabrata NOT DETECTED NOT DETECTED Final   Candida krusei NOT DETECTED NOT DETECTED Final   Candida parapsilosis NOT DETECTED NOT DETECTED Final   Candida tropicalis NOT DETECTED NOT DETECTED Final    Comment: Performed at [facility], [address] [place], [place] [address]  Culture, blood (Routine x 2)     Status: None (Preliminary result)   Collection Time: 07/24/2017  3:06 PM  Result Value Ref Range Status   Specimen Description BLOOD EJ  Final   Special Requests   Final    BOTTLES DRAWN AEROBIC AND ANAEROBIC Blood Culture results may not be optimal due to an excessive volume of blood received in culture bottles   Culture   Final    NO GROWTH < 24 HOURS Performed at [facility], [address] [place], [place] [address]    Report Status PENDING  Incomplete  MRSA PCR Screening     Status: None   Collection Time: 08/08/2017  6:16 PM  Result Value Ref Range Status   MRSA by PCR NEGATIVE NEGATIVE Final    Comment:        The GeneXpert MRSA Assay (FDA approved for NASAL specimens only), is one component of a comprehensive MRSA colonization surveillance program. It is not intended to diagnose MRSA infection nor  to guide or monitor treatment for MRSA infections. Performed at [facility], [address] [place], [place] [address]     Coagulation Studies: Recent Labs    08/02/2017 1323 07/14/17 0353  LABPROT 19.4* 28.3*  INR 1.65 2.68    Urinalysis: No results for input(s): COLORURINE, LABSPEC, PHURINE, GLUCOSEU, HGBUR, BILIRUBINUR, KETONESUR, PROTEINUR, UROBILINOGEN, NITRITE, LEUKOCYTESUR in the last 72 hours.  Invalid input(s): APPERANCEUR    Imaging: Dg Abd 1 View  Result Date: 08/06/2017 CLINICAL DATA:  MRI clearance. EXAM: ABDOMEN - 1 VIEW COMPARISON:  None. FINDINGS: Enteric tube in the stomach. Right femoral central venous catheter with tip in the lower IVC. The bowel gas pattern  is normal. No radio-opaque calculi or other significant radiographic abnormality are seen. Diffuse vascular calcifications. No acute osseous abnormality. No radiopaque density in the abdomen or pelvis. IMPRESSION: No metallic density in the abdomen or pelvis. Electronically Signed   By: Titus Dubin M.D.   On: 08/01/2017 23:08   Ct Head Wo Contrast  Result Date: 08/08/2017 CLINICAL DATA:  Altered level of consciousness EXAM: CT HEAD WITHOUT CONTRAST TECHNIQUE: Contiguous axial images were obtained from the base of the skull through the vertex without intravenous contrast. COMPARISON:  None. FINDINGS: Brain: Moderate chronic microvascular disease throughout the deep white matter. Chronic appearing bilateral basal ganglia and thalamic lacunar infarcts. No acute intracranial abnormality. Specifically, no hemorrhage, hydrocephalus, mass lesion, acute infarction, or significant intracranial injury. Vascular: No hyperdense vessel or unexpected calcification. Skull: No acute calvarial abnormality. Sinuses/Orbits: Visualized paranasal sinuses and mastoids clear. Orbital soft tissues unremarkable. Other: None IMPRESSION: Chronic microvascular disease. Chronic appearing bilateral basal ganglia and thalamic lacunar  infarcts. No acute intracranial abnormality. Electronically Signed   By: Rolm Baptise M.D.   On: 07/29/2017 14:01   Dg Chest Port 1 View  Result Date: 08/05/2017 CLINICAL DATA:  Intubation. EXAM: PORTABLE CHEST 1 VIEW COMPARISON:  Earlier today 1746 hours. FINDINGS: 1900 hours. Endotracheal tube has been retracted and terminates 2.9 cm above carina. Nasogastric tube looped in the stomach with tip at the proximal stomach. Midline trachea. Cardiomegaly accentuated by AP portable technique. Atherosclerosis in the transverse aorta. No right and no definite left pleural effusion. No pneumothorax. Low lung volumes with resultant pulmonary interstitial prominence. Overall improved aeration since the exam of earlier today. Persistent left base airspace disease and volume loss. IMPRESSION: Retraction of endotracheal tube, appropriately positioned 2.9 cm above carina. Aortic Atherosclerosis (ICD10-I70.0). Improved aeration with persistent left base Airspace disease, likely atelectasis. Electronically Signed   By: Abigail Miyamoto M.D.   On: 07/11/2017 19:24   Dg Chest Port 1 View  Result Date: 08/05/2017 CLINICAL DATA:  62 year old male post endotracheal tube and nasogastric tube placement. Subsequent encounter. EXAM: PORTABLE CHEST 1 VIEW COMPARISON:  07/31/2017 1:14 p.m. chest x-ray. FINDINGS: Patient has been intubated. Tip is in the right mainstem bronchus. This needs to be retracted by 4 cm. Nasogastric tube curled within the gastric fundus. Cardiomegaly. Crowding lung markings versus atelectasis left lung base. Suspect coronary artery calcification. Calcified slightly tortuous aorta. IMPRESSION: Patient has been intubated. Tip is in the right mainstem bronchus. This needs to be retracted by 4 cm. Nasogastric tube curled within the gastric fundus. Cardiomegaly. Crowding lung markings versus atelectasis left lung base. Suspect coronary artery calcification. Aortic Atherosclerosis (ICD10-I70.0). These results were called  by telephone at the time of interpretation on 08/05/2017 at 6:43 pm to [place], patients nurse, who verbally acknowledged these results. Electronically Signed   By: Genia Del M.D.   On: 08/06/2017 18:49   Dg Chest Port 1 View  Result Date: 07/19/2017 CLINICAL DATA:  Shortness of breath. Altered mental status EXAM: PORTABLE CHEST 1 VIEW COMPARISON:  None. FINDINGS: Cardiomegaly and coronary stenting. Aortic atherosclerosis. Low lung volumes. There is no edema, consolidation, effusion, or pneumothorax. IMPRESSION: No evidence of active disease. Cardiomegaly. Electronically Signed   By: Monte Fantasia M.D.   On: 08/07/2017 13:47      Assessment & Plan: Mr. Leanthony Rhett is a 62 y.o. black male with end stage renal disease on hemodialysis, diabetes mellitus type II insulin dependent, hypertension, anemia, hyperlipidemia, BPH, CVA with residual right sided weakness, coronary artery disease, who  was admitted to [facility] on 07/27/2017 for Sepsis, due to unspecified organism ([place]) [A41.9] Altered mental status, unspecified altered mental status type [R41.82] Leukopenia, unspecified type [D72.819] Sepsis ([place]) [A41.9]  [facility] MWF Left AVF   1. End Stage Renal Disease: with metabolic acidosis. No acute indication for dialysis. However hemodynamically unstable. If no improvement, will proceed with continous renal replacement therapy.  - Patient will need dialysis catheter for CRRT - Get records from [facility] [place] gtt  2. Hypotension/sepsis: with pancytopenia - Empiric pip/tazo - requiring vasopressors: norepinephrine gtt  3. Altered mental status: CT negative. MRI pending for today.  - Appreciate neurology input.   4. Respiratory failure: intubated and sedated.  - Appreciate pulm input.   5. Anemia with chronic kidney disease: hemoglobin 9.5 with pancytopenia - Appreciate heme/onc input.  - Granix - EPO as needed.   LOS: 1 Yenny Kosa 6/4/20198:50 AM

## 2017-07-14 NOTE — Progress Notes (Signed)
RN spoke with Dr. Wynelle LinkKolluru regarding bladder scan results of 350cc and discussed MRI results and that Dr. Thad Rangereynolds does not want to call family with results without speaking to them in person.  Dr. Wynelle LinkKolluru stated we will hold off on dialysis catheter and CRRT until family is updated tomorrow.  MD gave order to place foley catheter and that if no urine is returned within 12 hours once foley is inserted then remove foley.

## 2017-07-14 NOTE — Procedures (Signed)
ELECTROENCEPHALOGRAM REPORT   Patient: Marcus Romero       Room #: IC07A-AA EEG No. ID: [id number] Age: 62 y.o.        Sex: male Referring Physician: Mody Report Date:  07/14/2017        Interpreting Physician: Thana FarrEYNOLDS, Daisuke Bailey  History: Marcus Romero is an 62 y.o. male with altered mental status.  Now intubated and sedated.    Medications:  ASA, Pepcid, Fentanyl, Levophed, Zosyn, Vancomycin, Diprovan  Conditions of Recording:  This is a 16 channel EEG carried out with the patient in the intubated and sedated state.  Description:  The background activity is poorly organized and dominated by a low voltage, polymorphic delta activity that is continuous and diffusely distributed.  At times there is some superimposed theta activity and occasions when centrally, well organized alpha activity can be seen over both hemispheres, resembling sleep spindles.   No epileptiform activity is noted.  Hyperventilation and intermittent photic stimulation were not performed.   IMPRESSION: This EEG is characterized by slowing which is consistent with normal drowse.  Can not rule out the possibility of slowing related to general cerebral disturbance such as a metabolic encephalopathy or a medication effect.  Clinical correlation recommended.  No epileptiform activity is noted.     Thana FarrLeslie Kessler Kopinski, MD Neurology [phone] 07/14/2017, 1:45 PM

## 2017-07-14 NOTE — Progress Notes (Signed)
RN made Dr. Lonn Georgiaonforti aware that most recent troponin is 2.96, MD acknowledged.

## 2017-07-14 NOTE — Progress Notes (Signed)
eeg completed ° °

## 2017-07-14 NOTE — Progress Notes (Addendum)
Follow up - Critical Care Medicine Note  Patient Details:    Marcus Romero is an 62 y.o. male.past medical history remarkable for prior CVA, hypertension and end-stage renal disease on hemodialysis,was brought into the emergency department after passing out during hemodialysis. Upon arrival into the intensive care unit he was immediately intubated, central line was placed. Overnight patient's acidosis has worsened, started on a bicarbonate infusion, is being sedated with propofol and is on norepinephrine. Presently on antibiotic coverage with vancomycin and Zosyn  Lines, Airways, Drains: Airway 7.5 mm (Active)  Secured at (cm) 22 cm 07/14/2017  7:20 AM  Measured From Lips 07/14/2017  7:20 AM  Secured Location Left 07/14/2017  7:20 AM  Secured By [employer] Tube Holder 07/14/2017  7:20 AM  Tube Holder Repositioned Yes 07/14/2017  7:20 AM  Cuff Pressure (cm H2O) 26 cm H2O 07/14/2017  7:20 AM  Site Condition Dry 07/14/2017  7:20 AM     CVC Triple Lumen 07/11/2017 Right Femoral (Active)  Indication for Insertion or Continuance of Line Vasoactive infusions 07/14/2017  8:00 AM  Site Assessment Clean;Dry;Intact 07/22/2017  8:00 PM  Proximal Lumen Status Infusing;Flushed;Blood return noted 07/20/2017  8:00 PM  Medial Lumen Status Flushed;Saline locked;Blood return noted 07/28/2017  8:00 PM  Distal Lumen Status Flushed;Saline locked;In-line blood sampling system in place 07/23/2017  8:00 PM  Dressing Type Transparent;Occlusive 08/01/2017  8:00 PM  Dressing Status Clean;Dry;Intact;Antimicrobial disc in place 07/18/2017  8:00 PM  Line Care Connections checked and tightened 07/25/2017  8:00 PM  Dressing Intervention New dressing 07/31/2017  5:45 PM  Dressing Change Due 07/20/17 07/29/2017  8:00 PM     NG/OG Tube Orogastric 16 Fr. Center mouth Xray Documented cm marking at nare/ corner of mouth 70 cm (Active)  Cm Marking at Nare/Corner of Mouth (if applicable) 70 cm 07/14/2017  4:00 AM  Site Assessment Clean;Dry;Intact 07/14/2017  4:00  AM  Ongoing Placement Verification No change in cm markings or external length of tube from initial placement;No change in respiratory status;No acute changes, not attributed to clinical condition;Xray 07/14/2017  4:00 AM  Status Suction-low intermittent 07/14/2017  4:00 AM  Amount of suction 80 mmHg 07/27/2017  5:35 PM  Drainage Appearance Brown;Clear 07/20/2017  5:35 PM  Output (mL) 50 mL 07/14/2017  4:00 AM    Anti-infectives:  Anti-infectives (From admission, onward)   Start     Dose/Rate Route Frequency Ordered Stop   07/14/17 0200  piperacillin-tazobactam (ZOSYN) IVPB 3.375 g     3.375 g 12.5 mL/hr over 240 Minutes Intravenous Every 12 hours 08/06/2017 1513     07/12/2017 1511  vancomycin (VANCOCIN) IVPB 750 mg/150 ml premix     750 mg 150 mL/hr over 60 Minutes Intravenous Every Dialysis 08/04/2017 1513     07/27/2017 1330  piperacillin-tazobactam (ZOSYN) IVPB 3.375 g     3.375 g 100 mL/hr over 30 Minutes Intravenous  Once 08/03/2017 1323 07/19/2017 1402   07/12/2017 1330  vancomycin (VANCOCIN) IVPB 1000 mg/200 mL premix     1,000 mg 200 mL/hr over 60 Minutes Intravenous  Once 07/27/2017 1323 07/19/2017 1440      Microbiology: Results for orders placed or performed during the hospital encounter of 08/03/2017  Culture, blood (Routine x 2)     Status: None (Preliminary result)   Collection Time: 07/28/2017  1:23 PM  Result Value Ref Range Status   Specimen Description BLOOD BLOOD RIGHT FOREARM  Final   Special Requests   Final    BOTTLES DRAWN AEROBIC AND ANAEROBIC  Blood Culture adequate volume   Culture  Setup Time   Final    Organism ID to follow GRAM NEGATIVE RODS IN BOTH AEROBIC AND ANAEROBIC BOTTLES CRITICAL RESULT CALLED TO, READ BACK BY AND VERIFIED WITH: DAVID BESANTI ON 07/14/17 AT 0026 JAG Performed at [facility], [address] [place], [place] [address]    Culture GRAM NEGATIVE RODS  Final   Report Status PENDING  Incomplete  Blood Culture ID Panel (Reflexed)     Status:  Abnormal   Collection Time: 07/29/2017  1:23 PM  Result Value Ref Range Status   Enterococcus species NOT DETECTED NOT DETECTED Final   Listeria monocytogenes NOT DETECTED NOT DETECTED Final   Staphylococcus species NOT DETECTED NOT DETECTED Final   Staphylococcus aureus NOT DETECTED NOT DETECTED Final   Streptococcus species NOT DETECTED NOT DETECTED Final   Streptococcus agalactiae NOT DETECTED NOT DETECTED Final   Streptococcus pneumoniae NOT DETECTED NOT DETECTED Final   Streptococcus pyogenes NOT DETECTED NOT DETECTED Final   Acinetobacter baumannii NOT DETECTED NOT DETECTED Final   Enterobacteriaceae species DETECTED (A) NOT DETECTED Final    Comment: Enterobacteriaceae represent a large family of gram-negative bacteria, not a single organism. CRITICAL RESULT CALLED TO, READ BACK BY AND VERIFIED WITH: DAVID BESANTI ON 07/14/17 AT 0026 JAG    Enterobacter cloacae complex NOT DETECTED NOT DETECTED Final   Escherichia coli NOT DETECTED NOT DETECTED Final   Klebsiella oxytoca NOT DETECTED NOT DETECTED Final   Klebsiella pneumoniae NOT DETECTED NOT DETECTED Final   Proteus species NOT DETECTED NOT DETECTED Final   Serratia marcescens DETECTED (A) NOT DETECTED Final    Comment: CRITICAL RESULT CALLED TO, READ BACK BY AND VERIFIED WITH: DAVID BESANTI ON 07/14/17 AT 0026 JAG    Carbapenem resistance NOT DETECTED NOT DETECTED Final   Haemophilus influenzae NOT DETECTED NOT DETECTED Final   Neisseria meningitidis NOT DETECTED NOT DETECTED Final   Pseudomonas aeruginosa NOT DETECTED NOT DETECTED Final   Candida albicans NOT DETECTED NOT DETECTED Final   Candida glabrata NOT DETECTED NOT DETECTED Final   Candida krusei NOT DETECTED NOT DETECTED Final   Candida parapsilosis NOT DETECTED NOT DETECTED Final   Candida tropicalis NOT DETECTED NOT DETECTED Final    Comment: Performed at [facility], [address] [place], [place] [address]  Culture, blood (Routine x 2)     Status:  None (Preliminary result)   Collection Time: 07-29-17  3:06 PM  Result Value Ref Range Status   Specimen Description BLOOD EJ  Final   Special Requests   Final    BOTTLES DRAWN AEROBIC AND ANAEROBIC Blood Culture results may not be optimal due to an excessive volume of blood received in culture bottles   Culture   Final    NO GROWTH < 24 HOURS Performed at [facility], [address] [place], [place] [address]    Report Status PENDING  Incomplete  MRSA PCR Screening     Status: None   Collection Time: Jul 29, 2017  6:16 PM  Result Value Ref Range Status   MRSA by PCR NEGATIVE NEGATIVE Final    Comment:        The GeneXpert MRSA Assay (FDA approved for NASAL specimens only), is one component of a comprehensive MRSA colonization surveillance program. It is not intended to diagnose MRSA infection nor to guide or monitor treatment for MRSA infections. Performed at [facility], [address], [place], [place] [address]     Best  Practice/Protocols:  Heparin SQ  Studies: Dg Abd 1 View  Result Date: 08/08/2017 CLINICAL DATA:  MRI clearance. EXAM: ABDOMEN - 1 VIEW COMPARISON:  None. FINDINGS: Enteric tube in the stomach. Right femoral central venous catheter with tip in the lower IVC. The bowel gas pattern is normal. No radio-opaque calculi or other significant radiographic abnormality are seen. Diffuse vascular calcifications. No acute osseous abnormality. No radiopaque density in the abdomen or pelvis. IMPRESSION: No metallic density in the abdomen or pelvis. Electronically Signed   By: Obie Dredge M.D.   On: 07/25/2017 23:08   Ct Head Wo Contrast  Result Date: 07/22/2017 CLINICAL DATA:  Altered level of consciousness EXAM: CT HEAD WITHOUT CONTRAST TECHNIQUE: Contiguous axial images were obtained from the base of the skull through the vertex without intravenous contrast. COMPARISON:  None. FINDINGS: Brain: Moderate chronic microvascular disease throughout the  deep white matter. Chronic appearing bilateral basal ganglia and thalamic lacunar infarcts. No acute intracranial abnormality. Specifically, no hemorrhage, hydrocephalus, mass lesion, acute infarction, or significant intracranial injury. Vascular: No hyperdense vessel or unexpected calcification. Skull: No acute calvarial abnormality. Sinuses/Orbits: Visualized paranasal sinuses and mastoids clear. Orbital soft tissues unremarkable. Other: None IMPRESSION: Chronic microvascular disease. Chronic appearing bilateral basal ganglia and thalamic lacunar infarcts. No acute intracranial abnormality. Electronically Signed   By: Charlett Nose M.D.   On: 08/01/2017 14:01   Dg Chest Port 1 View  Result Date: 07/29/2017 CLINICAL DATA:  Intubation. EXAM: PORTABLE CHEST 1 VIEW COMPARISON:  Earlier today 1746 hours. FINDINGS: 1900 hours. Endotracheal tube has been retracted and terminates 2.9 cm above carina. Nasogastric tube looped in the stomach with tip at the proximal stomach. Midline trachea. Cardiomegaly accentuated by AP portable technique. Atherosclerosis in the transverse aorta. No right and no definite left pleural effusion. No pneumothorax. Low lung volumes with resultant pulmonary interstitial prominence. Overall improved aeration since the exam of earlier today. Persistent left base airspace disease and volume loss. IMPRESSION: Retraction of endotracheal tube, appropriately positioned 2.9 cm above carina. Aortic Atherosclerosis (ICD10-I70.0). Improved aeration with persistent left base Airspace disease, likely atelectasis. Electronically Signed   By: Jeronimo Greaves M.D.   On: 08/02/2017 19:24   Dg Chest Port 1 View  Result Date: 07/12/2017 CLINICAL DATA:  63 year old male post endotracheal tube and nasogastric tube placement. Subsequent encounter. EXAM: PORTABLE CHEST 1 VIEW COMPARISON:  08/07/2017 1:14 p.m. chest x-ray. FINDINGS: Patient has been intubated. Tip is in the right mainstem bronchus. This needs to be  retracted by 4 cm. Nasogastric tube curled within the gastric fundus. Cardiomegaly. Crowding lung markings versus atelectasis left lung base. Suspect coronary artery calcification. Calcified slightly tortuous aorta. IMPRESSION: Patient has been intubated. Tip is in the right mainstem bronchus. This needs to be retracted by 4 cm. Nasogastric tube curled within the gastric fundus. Cardiomegaly. Crowding lung markings versus atelectasis left lung base. Suspect coronary artery calcification. Aortic Atherosclerosis (ICD10-I70.0). These results were called by telephone at the time of interpretation on 08/09/2017 at 6:43 pm to [place], patients nurse, who verbally acknowledged these results. Electronically Signed   By: Lacy Duverney M.D.   On: 07/22/2017 18:49   Dg Chest Port 1 View  Result Date: 08/05/2017 CLINICAL DATA:  Shortness of breath. Altered mental status EXAM: PORTABLE CHEST 1 VIEW COMPARISON:  None. FINDINGS: Cardiomegaly and coronary stenting. Aortic atherosclerosis. Low lung volumes. There is no edema, consolidation, effusion, or pneumothorax. IMPRESSION: No evidence of active disease. Cardiomegaly. Electronically Signed   By: Kathrynn Ducking.D.  On: 08/05/2017 13:47    Consults: Treatment Team:  Kym Groom, MD [facility], [facility], MD Earna Coder, MD Lamont Dowdy, MD Marcina Millard, MD   Subjective:    Overnight Issues: Patient's acidosis worsens, started on bicarbonate infusion, presently on norepinephrine  Objective:  Vital signs for last 24 hours: Temp:  [95.9 F (35.5 C)-98.6 F (37 C)] 98.6 F (37 C) (06/04 0730) Pulse Rate:  [52-91] 83 (06/04 0830) Resp:  [15-44] 27 (06/04 0830) BP: (67-164)/(35-141) 96/67 (06/04 0830) SpO2:  [61 %-100 %] 97 % (06/04 0830) FiO2 (%):  [30 %-60 %] 30 % (06/04 0720) Weight:  [190 lb (86.2 kg)] 190 lb (86.2 kg) (06/03 1402)  Hemodynamic parameters for last 24 hours:    Intake/Output from previous day: 06/03  0701 - 06/04 0700 In: 1763.1 [I.V.:1763.1] Out: 50 [Emesis/NG output:50]  Intake/Output this shift: Total I/O In: 154.1 [I.V.:154.1] Out: -   Vent settings for last 24 hours: Vent Mode: PRVC FiO2 (%):  [30 %-60 %] 30 % Set Rate:  [16 bmp] 16 bmp Vt Set:  [500 mL] 500 mL PEEP:  [5 cmH20] 5 cmH20 Plateau Pressure:  [16 cmH20-18 cmH20] 16 cmH20  Physical Exam:  Vital signs: Please see the above listed vital signs HEENT, patient is orally intubated, trachea is midline, mild accessory muscle utilization Cardiovascular: Regular rate and rhythm with frequent ectopy, systolic murmur appreciated left parasternal border Pulmonary: Coarse rhonchi appreciated Abdominal: Positive bowel sounds, soft exam Extremities: Multiple areas of ulcerations, multiple wounds, blistering noted with serous drainage, being cultured Neurologic: Patient is unresponsive, on propofol  Assessment/Plan:   Respiratory failure. Patient was intubated placed on mechanical ventilation most likely secondary to increased work of breathing secondary to sepsis. We'll continue on protocol, not amenable at this time  Loss of consciousness. Unclear etiology, patient did not have a witnessed seizure, CT scan of his head did not reveal any acute hemorrhagic event, presently awake but confused in emergency department. This mail be metabolic/toxic encephalopathy.    End-stage renal failure, unable to complete hemodialysis yesterday, nephrology has been consulted  Septic shock. Patient is hypotensive, on norepinephrine, Zosyn and vancomycin,Enterobacteriaceae species detected, gram-negative rods in blood, Serratia marcescens also detected,will consult infectious disease to assist with antibiotic management. Concern blistering lesions on legs may reflect sepsis, will culture. We'll cover with stress dose steroids  Leukopenia. May be secondary to sepsis and marrow depression. Hematology has been consulted  Coagulopathy. Patient's  INR is elevated at 2.68, most likely reflects DIC, fibrinogen is 522 which may reflect acute phase reactant, hematology oncology has been consulted, patient not bleedingat this timewe'll hold on FFP, pending CBC from this morning  Lactic acidosis. Secondary to sepsis.   Elevated troponin. EKG reveals left bundle branch block, may be supply demand ischemia, will follow closely and may need cardiology consultation  Anemia. No evidence of active bleeding  Prior history of CVA. Head CT revealed old lacunar infarcts. Please see report, no acute intracranial event  Critical Care Total Time 45  Clancey Welton 07/14/2017  *Care during the described time interval was provided by me and/or other providers on the critical care team.  I have reviewed this patient's available data, including medical history, events of note, physical examination and test results as part of my evaluation.

## 2017-07-14 NOTE — Consult Note (Signed)
WOC Nurse wound consult note Reason for Consult:Epithelial blistering and sloughing to left upper thigh.  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 Wound type: Inflammatory and venous stasis Pressure Injury POA: NA Measurement: Left medial thigh, large ruptured blister and epithelial shedding:  12 cm x 16 cm x 0.2 cm   Left lateral posterior thigh:  8 cm x 6.4 cm x 0.2 cm  Left medial lower leg:  2 lesions:  2.4 cm x 2 cm x 0.2 cm each ruddy red Right lateral lower leg:  3 cm x 2.4 cm x 0.2 cm ruddy red Wound bed: pink and moist blistered wounds with serosanguinous effluent Drainage (amount, consistency, odor) moderate weeping to left thigh, saturating underpads Periwound: edema to left leg Dressing procedure/placement/frequency:Cleanse wounds to legs with soap and water.  Apply petrolatum to bilateral lower legs and left thigh for moisturization and to prevent epithelial sloughing.  Cover nonintact  areas with calcium alginate for absorption.  Cover with ABD pads and kerlix.  May use disposable underpads instead of kerlix and change PRN.  Change dressings daily.  Will not follow at this time.  Please re-consult if needed.  Maple HudsonKaren Amiera Herzberg RN BSN CWON Pager [phone]

## 2017-07-14 NOTE — Progress Notes (Signed)
Pupils continue to be non-reactive. Blood pressure maintained with levophed drip.  Dr. Thad Rangereynolds to speak with patient's daughter tomorrow about MRI results.  Dr. Wynelle LinkKolluru to discuss need for CRRT with daughter pending MRI result discussion.  Only 15 cc of urine output from foley since insertion therefore per Dr. Wynelle LinkKolluru if there is not a lot of urine output within 12 hours after insertion then foley should be removed.  RN reported this information to [place], [place] who is now taking over patient's care.

## 2017-07-14 NOTE — Progress Notes (Signed)
PHARMACY - PHYSICIAN COMMUNICATION CRITICAL VALUE ALERT - BLOOD CULTURE IDENTIFICATION (BCID)  Janace LittenReginald Bass is an 62 y.o. male who [facility] with a chief complaint of syncope during dialysis/LOC  Assessment:  95.62F, tachypneic, hypotensive (systolic 80's), O2 87 - 93, now on vent requiring 30 FiO2, WBC 1.8, pH 7.20, anion gap metabolic acidosis s/t septic shock, 2/4 GNR BCID Enterobacteriaceae Serratia Marcescens  Name of physician (or Provider) Contacted: Sonda Rumbleana Blakeney  Current antibiotics: Vanc/zosyn  Changes to prescribed antibiotics recommended:  Patient is on recommended antibiotics - No changes needed -- although BCID detected a specific bacteria, given patient's current clinical status will continue w/ broad spectrum abx for a couple of days and will de-escalate per patient's clinical status and/or f/u of Cx/Sx's  Results for orders placed or performed during the hospital encounter of 08/02/2017  Blood Culture ID Panel (Reflexed) (Collected: 07/20/2017  1:23 PM)  Result Value Ref Range   Enterococcus species NOT DETECTED NOT DETECTED   Listeria monocytogenes NOT DETECTED NOT DETECTED   Staphylococcus species NOT DETECTED NOT DETECTED   Staphylococcus aureus NOT DETECTED NOT DETECTED   Streptococcus species NOT DETECTED NOT DETECTED   Streptococcus agalactiae NOT DETECTED NOT DETECTED   Streptococcus pneumoniae NOT DETECTED NOT DETECTED   Streptococcus pyogenes NOT DETECTED NOT DETECTED   Acinetobacter baumannii NOT DETECTED NOT DETECTED   Enterobacteriaceae species DETECTED (A) NOT DETECTED   Enterobacter cloacae complex NOT DETECTED NOT DETECTED   Escherichia coli NOT DETECTED NOT DETECTED   Klebsiella oxytoca NOT DETECTED NOT DETECTED   Klebsiella pneumoniae NOT DETECTED NOT DETECTED   Proteus species NOT DETECTED NOT DETECTED   Serratia marcescens DETECTED (A) NOT DETECTED   Carbapenem resistance NOT DETECTED NOT DETECTED   Haemophilus influenzae  NOT DETECTED NOT DETECTED   Neisseria meningitidis NOT DETECTED NOT DETECTED   Pseudomonas aeruginosa NOT DETECTED NOT DETECTED   Candida albicans NOT DETECTED NOT DETECTED   Candida glabrata NOT DETECTED NOT DETECTED   Candida krusei NOT DETECTED NOT DETECTED   Candida parapsilosis NOT DETECTED NOT DETECTED   Candida tropicalis NOT DETECTED NOT DETECTED   Thomasene Rippleavid Rael Tilly, PharmD, BCPS Clinical Pharmacist 07/14/2017

## 2017-07-14 NOTE — Progress Notes (Signed)
Patient ID: Janace LittenReginald Lazenby, male   DOB: 10/15/1955, 62 y.o.   MRN: [id number] Pulmonary Critical Care Attending  Patient may require an HD catheter secondary for unable to perform CRRT through fistula.  Pending MRI Will give FFP for procedural purposes.  Tora KindredJohn Atiba Kimberlin, DO

## 2017-07-14 NOTE — Progress Notes (Signed)
Patient off unit to MRI with Merry ProudBrandi, RN SWOT nurse and Greggory StallionGeorge, RRT.

## 2017-07-14 NOTE — Progress Notes (Signed)
Lab called regarding lactic acid that resutled as 6.8 earlier. Per lab, it had been calculated incorrectly and correct result 13. Dr. Sung AmabileSimonds notifed, no new orders received. Primary nurse also told.

## 2017-07-14 NOTE — Progress Notes (Signed)
*  PRELIMINARY RESULTS* Echocardiogram 2D Echocardiogram has been performed.  Cristela BlueHege, Nataline Basara 07/14/2017, 3:35 PM

## 2017-07-14 NOTE — Consult Note (Signed)
[facility] Cardiology  CARDIOLOGY CONSULT NOTE  Patient ID: Marcus Romero MRN: [id number] DOB/AGE: 64/11/1955 64 y.o.  Admit date: 07/19/2017 Referring Physician [facility] Primary Physician  Primary Cardiologist  Reason for Consultation elevated troponin  HPI: 62 year old gentleman referred for evaluation of elevated troponin.  He has known end-stage renal disease, on chronic hemodialysis.  Patient apparently had decreased consciousness and dialysis brought to the [facility] emergency room.  Noted to have leukopenia, hypotension, lactic acidosis, with nonhealing wounds, all consistent with septic shock, requiring intubation and ventilator support.  ECG reveals left bundle branch block.  Admission labs notable for elevated troponin I.81, 1.92, 2.20.  Review of systems complete and found to be negative unless listed above     Past Medical History:  Diagnosis Date  . CVA (cerebral vascular accident) (HCC)   . ESRD on dialysis (HCC)   . Hypertension     History reviewed. No pertinent surgical history.  Medications Prior to Admission  Medication Sig Dispense Refill Last Dose  . carvedilol (COREG) 25 MG tablet Take 25 mg by mouth 2 (two) times daily.     . insulin NPH Human (HUMULIN N) 100 UNIT/ML injection Inject 15 Units into the skin 2 (two) times daily.     Marland Kitchen lisinopril (PRINIVIL,ZESTRIL) 40 MG tablet Take 40 mg by mouth daily.  3   . loperamide (IMODIUM) 2 MG capsule Take 2 mg by mouth 3 (three) times daily as needed.  0    Social History   Socioeconomic History  . Marital status: Married    Spouse name: Not on file  . Number of children: Not on file  . Years of education: Not on file  . Highest education level: Not on file  Occupational History  . Not on file  Social Needs  . Financial resource strain: Not on file  . Food insecurity:    Worry: Not on file    Inability: Not on file  . Transportation needs:    Medical: Not on file    Non-medical: Not on file  Tobacco Use  . Smoking  status: Not on file  Substance and Sexual Activity  . Alcohol use: Not on file  . Drug use: Not on file  . Sexual activity: Not on file  Lifestyle  . Physical activity:    Days per week: Not on file    Minutes per session: Not on file  . Stress: Not on file  Relationships  . Social connections:    Talks on phone: Not on file    Gets together: Not on file    Attends religious service: Not on file    Active member of club or organization: Not on file    Attends meetings of clubs or organizations: Not on file    Relationship status: Not on file  . Intimate partner violence:    Fear of current or ex partner: Not on file    Emotionally abused: Not on file    Physically abused: Not on file    Forced sexual activity: Not on file  Other Topics Concern  . Not on file  Social History Narrative  . Not on file    No family history on file.    Review of systems complete and found to be negative unless listed above      PHYSICAL EXAM  General: Well developed, well nourished, in no acute distress HEENT:  Normocephalic and atramatic Neck:  No JVD.  Lungs: Clear bilaterally to auscultation and percussion. Heart: HRRR .  Normal S1 and S2 without gallops or murmurs.  Abdomen: Bowel sounds are positive, abdomen soft and non-tender  Msk:  Back normal, normal gait. Normal strength and tone for age. Extremities: No clubbing, cyanosis or edema.   Neuro: Alert and oriented X 3. Psych:  Good affect, responds appropriately  Labs:   Lab Results  Component Value Date   WBC 1.8 (L) 08/03/2017   HGB 9.5 (L) 07/31/2017   HCT 31.1 (L) 07/12/2017   MCV 105.9 (H) 07/21/2017   PLT 117 (L) 07/25/2017    Recent Labs  Lab 08/05/2017 1826 07/14/17 0353  NA 134* 140  K 4.8 4.7  CL 96* 92*  CO2 14* 16*  BUN 74* 81*  CREATININE 6.75* 7.07*  CALCIUM 7.6* 7.5*  PROT 5.4*  --   BILITOT 4.3*  --   ALKPHOS 120  --   ALT 34  --   AST 68*  --   GLUCOSE 207* 161*   Lab Results  Component  Value Date   TROPONINI 2.20 (HH) 07/14/2017   No results found for: CHOL No results found for: HDL No results found for: [facility] Lab Results  Component Value Date   TRIG 168 (H) 07/12/2017   No results found for: CHOLHDL No results found for: LDLDIRECT    Radiology: Dg Abd 1 View  Result Date: 07/11/2017 CLINICAL DATA:  MRI clearance. EXAM: ABDOMEN - 1 VIEW COMPARISON:  None. FINDINGS: Enteric tube in the stomach. Right femoral central venous catheter with tip in the lower IVC. The bowel gas pattern is normal. No radio-opaque calculi or other significant radiographic abnormality are seen. Diffuse vascular calcifications. No acute osseous abnormality. No radiopaque density in the abdomen or pelvis. IMPRESSION: No metallic density in the abdomen or pelvis. Electronically Signed   By: Obie Dredge M.D.   On: 07/14/2017 23:08   Ct Head Wo Contrast  Result Date: 07/11/2017 CLINICAL DATA:  Altered level of consciousness EXAM: CT HEAD WITHOUT CONTRAST TECHNIQUE: Contiguous axial images were obtained from the base of the skull through the vertex without intravenous contrast. COMPARISON:  None. FINDINGS: Brain: Moderate chronic microvascular disease throughout the deep white matter. Chronic appearing bilateral basal ganglia and thalamic lacunar infarcts. No acute intracranial abnormality. Specifically, no hemorrhage, hydrocephalus, mass lesion, acute infarction, or significant intracranial injury. Vascular: No hyperdense vessel or unexpected calcification. Skull: No acute calvarial abnormality. Sinuses/Orbits: Visualized paranasal sinuses and mastoids clear. Orbital soft tissues unremarkable. Other: None IMPRESSION: Chronic microvascular disease. Chronic appearing bilateral basal ganglia and thalamic lacunar infarcts. No acute intracranial abnormality. Electronically Signed   By: Charlett Nose M.D.   On: 08/06/2017 14:01   Dg Chest Port 1 View  Result Date: 08/02/2017 CLINICAL DATA:  Intubation. EXAM:  PORTABLE CHEST 1 VIEW COMPARISON:  Earlier today 1746 hours. FINDINGS: 1900 hours. Endotracheal tube has been retracted and terminates 2.9 cm above carina. Nasogastric tube looped in the stomach with tip at the proximal stomach. Midline trachea. Cardiomegaly accentuated by AP portable technique. Atherosclerosis in the transverse aorta. No right and no definite left pleural effusion. No pneumothorax. Low lung volumes with resultant pulmonary interstitial prominence. Overall improved aeration since the exam of earlier today. Persistent left base airspace disease and volume loss. IMPRESSION: Retraction of endotracheal tube, appropriately positioned 2.9 cm above carina. Aortic Atherosclerosis (ICD10-I70.0). Improved aeration with persistent left base Airspace disease, likely atelectasis. Electronically Signed   By: Jeronimo Greaves M.D.   On: 08/05/2017 19:24   Dg Chest Port 1 View  Result Date:  07/30/2017 CLINICAL DATA:  62 year old male post endotracheal tube and nasogastric tube placement. Subsequent encounter. EXAM: PORTABLE CHEST 1 VIEW COMPARISON:  12/29/2017 1:14 p.m. chest x-ray. FINDINGS: Patient has been intubated. Tip is in the right mainstem bronchus. This needs to be retracted by 4 cm. Nasogastric tube curled within the gastric fundus. Cardiomegaly. Crowding lung markings versus atelectasis left lung base. Suspect coronary artery calcification. Calcified slightly tortuous aorta. IMPRESSION: Patient has been intubated. Tip is in the right mainstem bronchus. This needs to be retracted by 4 cm. Nasogastric tube curled within the gastric fundus. Cardiomegaly. Crowding lung markings versus atelectasis left lung base. Suspect coronary artery calcification. Aortic Atherosclerosis (ICD10-I70.0). These results were called by telephone at the time of interpretation on 08/05/2017 at 6:43 pm to [place] Rudd, patients nurse, who verbally acknowledged these results. Electronically Signed   By: Lacy DuverneySteven  Olson M.D.   On:  12/29/2017 18:49   Dg Chest Port 1 View  Result Date: 08/02/2017 CLINICAL DATA:  Shortness of breath. Altered mental status EXAM: PORTABLE CHEST 1 VIEW COMPARISON:  None. FINDINGS: Cardiomegaly and coronary stenting. Aortic atherosclerosis. Low lung volumes. There is no edema, consolidation, effusion, or pneumothorax. IMPRESSION: No evidence of active disease. Cardiomegaly. Electronically Signed   By: Marnee SpringJonathon  Watts M.D.   On: 12/29/2017 13:47    EKG: Left bundle branch block  ASSESSMENT AND PLAN:   1.  Elevated troponin, nondiagnostic ECG with left bundle branch block, in the setting of toxic shock, likely demand supply ischemia.  Patient very poor candidate for cardiac catheterization. 2.  Septic shock, lactic acidosis, nonhealing wounds 3.  Increased consciousness, history of CVA with right hemiparesis 4.  End-stage renal disease, on chronic hemodialysis  Recommendations  1.  Agree with current therapy 2.  Defer full dose anticoagulation 3.  Review 2D echocardiogram 4.  Defer cardiac catheterization  Signed: Marcina Millardlexander Countess Biebel MD,PhD, [facility] 07/14/2017, 8:27 AM

## 2017-07-14 NOTE — Progress Notes (Addendum)
[employer] [facility] [place] [facility]   PATIENT NAME: Marcus LittenReginald Romero    MR#:  [id number]  DATE OF BIRTH:  08/11/1955  SUBJECTIVE:   She is intubated and sedated on vent  REVIEW OF SYSTEMS:    Intubated and sedated   Tolerating Diet: npo      DRUG ALLERGIES:  Not on File  VITALS:  Blood pressure 110/61, pulse 82, temperature 98.6 F (37 C), temperature source Oral, resp. rate (!) 29, height 5\' 2"  (1.575 m), weight 86.2 kg (190 lb), SpO2 96 %.  PHYSICAL EXAMINATION:  Constitutional: Appears well-developed and well-nourished. No distress. HENT: Normocephalic. . Intubated  eyes: Conjunctivae are normal. PERRLA, no scleral icterus.  Neck: Normal ROM. Neck supple. No JVD. No tracheal deviation. CVS: tachy irr, irr, no murmurs, no gallops, no carotid bruit.  Pulmonary: Effort and breath sounds normal, no stridor, rhonchi, wheezes, rales.  Abdominal: Soft. BS +,  no distension, tenderness, rebound or guarding.  Musculoskeletal: Normal range of motion.  No lower extremity edema Neuro: Intubated sedated   skin: Multiple areas of ulcerations and wounds blistering on lower extremities Psychiatric: Sedated    LABORATORY PANEL:   CBC Recent Labs  Lab 07/14/17 0840  WBC 4.3  HGB 9.8*  HCT 31.2*  PLT 75*   ------------------------------------------------------------------------------------------------------------------  Chemistries  Recent Labs  Lab 07/18/2017 1826 07/14/17 0353 07/14/17 0907  NA 134* 140  --   K 4.8 4.7  --   CL 96* 92*  --   CO2 14* 16*  --   GLUCOSE 207* 161*  --   BUN 74* 81*  --   CREATININE 6.75* 7.07* 1.00  CALCIUM 7.6* 7.5*  --   MG  --  2.0  --   AST 68*  --   --   ALT 34  --   --   ALKPHOS 120  --   --   BILITOT 4.3*  --   --    ------------------------------------------------------------------------------------------------------------------  Cardiac Enzymes Recent Labs  Lab 08/05/2017 1929 07/14/17 0353  07/14/17 0840  TROPONINI 1.92* 2.20* 2.96*   ------------------------------------------------------------------------------------------------------------------  RADIOLOGY:  Dg Abd 1 View  Result Date: 07/12/2017 CLINICAL DATA:  MRI clearance. EXAM: ABDOMEN - 1 VIEW COMPARISON:  None. FINDINGS: Enteric tube in the stomach. Right femoral central venous catheter with tip in the lower IVC. The bowel gas pattern is normal. No radio-opaque calculi or other significant radiographic abnormality are seen. Diffuse vascular calcifications. No acute osseous abnormality. No radiopaque density in the abdomen or pelvis. IMPRESSION: No metallic density in the abdomen or pelvis. Electronically Signed   By: Obie DredgeWilliam T Derry M.D.   On: 07/25/2017 23:08   Ct Head Wo Contrast  Result Date: 07/16/2017 CLINICAL DATA:  Altered level of consciousness EXAM: CT HEAD WITHOUT CONTRAST TECHNIQUE: Contiguous axial images were obtained from the base of the skull through the vertex without intravenous contrast. COMPARISON:  None. FINDINGS: Brain: Moderate chronic microvascular disease throughout the deep white matter. Chronic appearing bilateral basal ganglia and thalamic lacunar infarcts. No acute intracranial abnormality. Specifically, no hemorrhage, hydrocephalus, mass lesion, acute infarction, or significant intracranial injury. Vascular: No hyperdense vessel or unexpected calcification. Skull: No acute calvarial abnormality. Sinuses/Orbits: Visualized paranasal sinuses and mastoids clear. Orbital soft tissues unremarkable. Other: None IMPRESSION: Chronic microvascular disease. Chronic appearing bilateral basal ganglia and thalamic lacunar infarcts. No acute intracranial abnormality. Electronically Signed   By: Charlett NoseKevin  Dover M.D.   On: 07/14/2017 14:01   [facility]  Result  Date: 07/30/2017 CLINICAL DATA:  Intubation. EXAM: PORTABLE CHEST 1 VIEW COMPARISON:  Earlier today 1746 hours. FINDINGS: 1900 hours. Endotracheal tube  has been retracted and terminates 2.9 cm above carina. Nasogastric tube looped in the stomach with tip at the proximal stomach. Midline trachea. Cardiomegaly accentuated by AP portable technique. Atherosclerosis in the transverse aorta. No right and no definite left pleural effusion. No pneumothorax. Low lung volumes with resultant pulmonary interstitial prominence. Overall improved aeration since the exam of earlier today. Persistent left base airspace disease and volume loss. IMPRESSION: Retraction of endotracheal tube, appropriately positioned 2.9 cm above carina. Aortic Atherosclerosis (ICD10-I70.0). Improved aeration with persistent left base Airspace disease, likely atelectasis. Electronically Signed   By: Jeronimo Greaves M.D.   On: 08/04/2017 19:24   Dg Chest Port 1 View  Result Date: 07/20/2017 CLINICAL DATA:  62 year old male post endotracheal tube and nasogastric tube placement. Subsequent encounter. EXAM: PORTABLE CHEST 1 VIEW COMPARISON:  07/22/2017 1:14 p.m. chest x-ray. FINDINGS: Patient has been intubated. Tip is in the right mainstem bronchus. This needs to be retracted by 4 cm. Nasogastric tube curled within the gastric fundus. Cardiomegaly. Crowding lung markings versus atelectasis left lung base. Suspect coronary artery calcification. Calcified slightly tortuous aorta. IMPRESSION: Patient has been intubated. Tip is in the right mainstem bronchus. This needs to be retracted by 4 cm. Nasogastric tube curled within the gastric fundus. Cardiomegaly. Crowding lung markings versus atelectasis left lung base. Suspect coronary artery calcification. Aortic Atherosclerosis (ICD10-I70.0). These results were called by telephone at the time of interpretation on 07/25/2017 at 6:43 pm to [place], patients nurse, who verbally acknowledged these results. Electronically Signed   By: Lacy Duverney M.D.   On: 07/14/2017 18:49   Dg Chest Port 1 View  Result Date: 07/23/2017 CLINICAL DATA:  Shortness of breath.  Altered mental status EXAM: PORTABLE CHEST 1 VIEW COMPARISON:  None. FINDINGS: Cardiomegaly and coronary stenting. Aortic atherosclerosis. Low lung volumes. There is no edema, consolidation, effusion, or pneumothorax. IMPRESSION: No evidence of active disease. Cardiomegaly. Electronically Signed   By: Marnee Spring M.D.   On: 07/23/2017 13:47     ASSESSMENT AND PLAN:   62 year old male with PAF, end-stage renal disease on hemodialysis and CVA who presents to the emergency room after syncopal episode during dialysis.  1.  Acute hypoxic respiratory failure: Patient is intubated and is on mechanical ventilation. Vent management as per intensivist.  2.  Syncopal episode during dialysis: CT scan was negative for acute hemorrhagic event. Follow-up on EEG and MRI as recommended by neurology  3.  Elevated troponin: As per cardiology this is due to demand ischemia.  4.  Septic shock with gram-negative rod bacteremia (Serratia) ID consultation requested Blistering lesions on legs were cultured Continue pressors and stress dose steroids Continue Zosyn and vancomycin  5.  Leukopenia: This is due to sepsis and bone marrow suppression as per oncology evaluation 6.  Thrombocytopenia: This is related to sepsis/infectious etiology Follow-up on oncology evaluation.  7.  End-stage renal disease on hemodialysis: Management as per nephrology  8.  History of CVA       CODE STATUS: Full  TOTAL TIME TAKING CARE OF THIS PATIENT: 30 minutes.     POSSIBLE D/C 3 to 4 days, DEPENDING ON CLINICAL CONDITION.   Brittny Spangle M.D on 07/14/2017 at 1:27 PM  Between 7am to 6pm - Pager - [phone] After 6pm go to [email or web address] - Social research officer, government  [facility]  Office  [phone]  CC: Primary  care physician; Patient, No Pcp Per  Note: This dictation was prepared with Dragon dictation along with smaller phrase technology. Any transcriptional errors that result from this process  are unintentional.

## 2017-07-14 NOTE — Progress Notes (Signed)
Subjective: Patient now intubated and sedated.  Septic.    Objective: Current vital signs: BP 91/61   Pulse 74   Temp 98.2 F (36.8 C) (Oral)   Resp (!) 29   Ht 5' 2"  (1.575 m)   Wt 86.2 kg (190 lb)   SpO2 94%   BMI 34.75 kg/m  Vital signs in last 24 hours: Temp:  [95.9 F (35.5 C)-98.6 F (37 C)] 98.2 F (36.8 C) (06/04 1145) Pulse Rate:  [52-91] 74 (06/04 1200) Resp:  [15-44] 29 (06/04 1200) BP: (67-164)/(35-141) 91/61 (06/04 1200) SpO2:  [61 %-100 %] 94 % (06/04 1200) FiO2 (%):  [30 %-60 %] 30 % (06/04 0720) Weight:  [86.2 kg (190 lb)] 86.2 kg (190 lb) (06/03 1402)  Intake/Output from previous day: 06/03 0701 - 06/04 0700 In: 1763.1 [I.V.:1763.1] Out: 50 [Emesis/NG output:50] Intake/Output this shift: Total I/O In: 585.8 [I.V.:525.8; NG/GT:60] Out: -  Nutritional status:  Diet Order    None      Neurologic Exam: Mental Status: Patient does not respond to verbal stimuli.  Does not respond to deep sternal rub.  Does not follow commands.  No verbalizations noted.  Cranial Nerves: II: patient does not respond confrontation bilaterally, pupils right 2 mm, left 2 mm,and unreactive bilaterally III,IV,VI: doll's response absent bilaterally.  V,VII: corneal reflex absent bilaterally  VIII: patient does not respond to verbal stimuli IX,X: gag reflex reduced, XI: trapezius strength unable to test bilaterally XII: tongue strength unable to test Motor: Extremities flaccid throughout.  No spontaneous movement noted.  No purposeful movements noted. Sensory: Does not respond to noxious stimuli in any extremity.  Lab Results: Basic Metabolic Panel: Recent Labs  Lab 08/05/2017 1323 07/22/2017 1826 07/14/17 0353 07/14/17 0907  NA 138 134* 140  --   K 4.5 4.8 4.7  --   CL 96* 96* 92*  --   CO2 24 14* 16*  --   GLUCOSE 64* 207* 161*  --   BUN 82* 74* 81*  --   CREATININE 6.69* 6.75* 7.07* 1.00  CALCIUM 8.5* 7.6* 7.5*  --   MG  --   --  2.0  --   PHOS  --   --  7.8*   --     Liver Function Tests: Recent Labs  Lab 07/28/2017 1323 07/17/2017 1826  AST 52* 68*  ALT 34 34  ALKPHOS 139* 120  BILITOT 4.9* 4.3*  PROT 6.7 5.4*  ALBUMIN 2.6* 2.0*   No results for input(s): LIPASE, AMYLASE in the last 168 hours. No results for input(s): AMMONIA in the last 168 hours.  CBC: Recent Labs  Lab 07/22/2017 1323 07/28/2017 1826 07/14/17 0840  WBC 1.0* 1.8* 4.3  NEUTROABS 0.8*  --  2.5  HGB 10.7* 9.5* 9.8*  HCT 32.8* 31.1* 31.2*  MCV 100.6* 105.9* 102.6*  PLT 141* 117* 75*    Cardiac Enzymes: Recent Labs  Lab 07/30/2017 1323 08/09/2017 1826 07/27/2017 1929 07/14/17 0353 07/14/17 0840  TROPONINI 2.07* 1.81* 1.92* 2.20* 2.96*    Lipid Panel: Recent Labs  Lab 08/02/2017 1702  TRIG 168*    CBG: Recent Labs  Lab 07/25/2017 1944 07/14/17 0020 07/14/17 0121 07/14/17 0330 07/14/17 0819  GLUCAP 95 65 108* 130* 142*    Microbiology: Results for orders placed or performed during the hospital encounter of 07/18/2017  Culture, blood (Routine x 2)     Status: None (Preliminary result)   Collection Time: 07/18/2017  1:23 PM  Result Value Ref Range Status  Specimen Description BLOOD BLOOD RIGHT FOREARM  Final   Special Requests   Final    BOTTLES DRAWN AEROBIC AND ANAEROBIC Blood Culture adequate volume   Culture  Setup Time   Final    Organism ID to follow GRAM NEGATIVE RODS IN BOTH AEROBIC AND ANAEROBIC BOTTLES CRITICAL RESULT CALLED TO, READ BACK BY AND VERIFIED WITH: DAVID BESANTI ON 07/14/17 AT 0026 JAG Performed at [facility], [address] [place], [place] [address]    Culture GRAM NEGATIVE RODS  Final   Report Status PENDING  Incomplete  Blood Culture ID Panel (Reflexed)     Status: Abnormal   Collection Time: 08/06/2017  1:23 PM  Result Value Ref Range Status   Enterococcus species NOT DETECTED NOT DETECTED Final   Listeria monocytogenes NOT DETECTED NOT DETECTED Final   Staphylococcus species NOT DETECTED NOT DETECTED Final    Staphylococcus aureus NOT DETECTED NOT DETECTED Final   Streptococcus species NOT DETECTED NOT DETECTED Final   Streptococcus agalactiae NOT DETECTED NOT DETECTED Final   Streptococcus pneumoniae NOT DETECTED NOT DETECTED Final   Streptococcus pyogenes NOT DETECTED NOT DETECTED Final   Acinetobacter baumannii NOT DETECTED NOT DETECTED Final   Enterobacteriaceae species DETECTED (A) NOT DETECTED Final    Comment: Enterobacteriaceae represent a large family of gram-negative bacteria, not a single organism. CRITICAL RESULT CALLED TO, READ BACK BY AND VERIFIED WITH: DAVID BESANTI ON 07/14/17 AT 0026 JAG    Enterobacter cloacae complex NOT DETECTED NOT DETECTED Final   Escherichia coli NOT DETECTED NOT DETECTED Final   Klebsiella oxytoca NOT DETECTED NOT DETECTED Final   Klebsiella pneumoniae NOT DETECTED NOT DETECTED Final   Proteus species NOT DETECTED NOT DETECTED Final   Serratia marcescens DETECTED (A) NOT DETECTED Final    Comment: CRITICAL RESULT CALLED TO, READ BACK BY AND VERIFIED WITH: DAVID BESANTI ON 07/14/17 AT 0026 JAG    Carbapenem resistance NOT DETECTED NOT DETECTED Final   Haemophilus influenzae NOT DETECTED NOT DETECTED Final   Neisseria meningitidis NOT DETECTED NOT DETECTED Final   Pseudomonas aeruginosa NOT DETECTED NOT DETECTED Final   Candida albicans NOT DETECTED NOT DETECTED Final   Candida glabrata NOT DETECTED NOT DETECTED Final   Candida krusei NOT DETECTED NOT DETECTED Final   Candida parapsilosis NOT DETECTED NOT DETECTED Final   Candida tropicalis NOT DETECTED NOT DETECTED Final    Comment: Performed at [facility], [address] [place], [place] [address]  Culture, blood (Routine x 2)     Status: None (Preliminary result)   Collection Time: 07/27/2017  3:06 PM  Result Value Ref Range Status   Specimen Description BLOOD EJ  Final   Special Requests   Final    BOTTLES DRAWN AEROBIC AND ANAEROBIC Blood Culture results may not be optimal due to an  excessive volume of blood received in culture bottles   Culture   Final    NO GROWTH < 24 HOURS Performed at [facility], [address] [place], [place] [address]    Report Status PENDING  Incomplete  MRSA PCR Screening     Status: None   Collection Time: 08/07/2017  6:16 PM  Result Value Ref Range Status   MRSA by PCR NEGATIVE NEGATIVE Final    Comment:        The GeneXpert MRSA Assay (FDA approved for NASAL specimens only), is one component of a comprehensive MRSA colonization surveillance program. It is not intended to diagnose MRSA infection nor to guide  or monitor treatment for MRSA infections. Performed at [facility], [address] [place], [place] [address]     Coagulation Studies: Recent Labs    07/23/2017 1323 07/14/17 0353  LABPROT 19.4* 28.3*  INR 1.65 2.68    Imaging: Dg Abd 1 View  Result Date: 07/25/2017 CLINICAL DATA:  MRI clearance. EXAM: ABDOMEN - 1 VIEW COMPARISON:  None. FINDINGS: Enteric tube in the stomach. Right femoral central venous catheter with tip in the lower IVC. The bowel gas pattern is normal. No radio-opaque calculi or other significant radiographic abnormality are seen. Diffuse vascular calcifications. No acute osseous abnormality. No radiopaque density in the abdomen or pelvis. IMPRESSION: No metallic density in the abdomen or pelvis. Electronically Signed   By: Titus Dubin M.D.   On: 08/06/2017 23:08   Ct Head Wo Contrast  Result Date: 08/09/2017 CLINICAL DATA:  Altered level of consciousness EXAM: CT HEAD WITHOUT CONTRAST TECHNIQUE: Contiguous axial images were obtained from the base of the skull through the vertex without intravenous contrast. COMPARISON:  None. FINDINGS: Brain: Moderate chronic microvascular disease throughout the deep white matter. Chronic appearing bilateral basal ganglia and thalamic lacunar infarcts. No acute intracranial abnormality. Specifically, no hemorrhage, hydrocephalus, mass lesion,  acute infarction, or significant intracranial injury. Vascular: No hyperdense vessel or unexpected calcification. Skull: No acute calvarial abnormality. Sinuses/Orbits: Visualized paranasal sinuses and mastoids clear. Orbital soft tissues unremarkable. Other: None IMPRESSION: Chronic microvascular disease. Chronic appearing bilateral basal ganglia and thalamic lacunar infarcts. No acute intracranial abnormality. Electronically Signed   By: Rolm Baptise M.D.   On: 08/09/2017 14:01   Dg Chest Port 1 View  Result Date: 08/02/2017 CLINICAL DATA:  Intubation. EXAM: PORTABLE CHEST 1 VIEW COMPARISON:  Earlier today 1746 hours. FINDINGS: 1900 hours. Endotracheal tube has been retracted and terminates 2.9 cm above carina. Nasogastric tube looped in the stomach with tip at the proximal stomach. Midline trachea. Cardiomegaly accentuated by AP portable technique. Atherosclerosis in the transverse aorta. No right and no definite left pleural effusion. No pneumothorax. Low lung volumes with resultant pulmonary interstitial prominence. Overall improved aeration since the exam of earlier today. Persistent left base airspace disease and volume loss. IMPRESSION: Retraction of endotracheal tube, appropriately positioned 2.9 cm above carina. Aortic Atherosclerosis (ICD10-I70.0). Improved aeration with persistent left base Airspace disease, likely atelectasis. Electronically Signed   By: Abigail Miyamoto M.D.   On: 08/09/2017 19:24   Dg Chest Port 1 View  Result Date: 07/28/2017 CLINICAL DATA:  62 year old male post endotracheal tube and nasogastric tube placement. Subsequent encounter. EXAM: PORTABLE CHEST 1 VIEW COMPARISON:  07/16/2017 1:14 p.m. chest x-ray. FINDINGS: Patient has been intubated. Tip is in the right mainstem bronchus. This needs to be retracted by 4 cm. Nasogastric tube curled within the gastric fundus. Cardiomegaly. Crowding lung markings versus atelectasis left lung base. Suspect coronary artery calcification.  Calcified slightly tortuous aorta. IMPRESSION: Patient has been intubated. Tip is in the right mainstem bronchus. This needs to be retracted by 4 cm. Nasogastric tube curled within the gastric fundus. Cardiomegaly. Crowding lung markings versus atelectasis left lung base. Suspect coronary artery calcification. Aortic Atherosclerosis (ICD10-I70.0). These results were called by telephone at the time of interpretation on 08/01/2017 at 6:43 pm to [place], patients nurse, who verbally acknowledged these results. Electronically Signed   By: Genia Del M.D.   On: 07/29/2017 18:49   Dg Chest Port 1 View  Result Date: 08/07/2017 CLINICAL DATA:  Shortness of breath. Altered mental status EXAM: PORTABLE CHEST 1  VIEW COMPARISON:  None. FINDINGS: Cardiomegaly and coronary stenting. Aortic atherosclerosis. Low lung volumes. There is no edema, consolidation, effusion, or pneumothorax. IMPRESSION: No evidence of active disease. Cardiomegaly. Electronically Signed   By: Monte Fantasia M.D.   On: 07/18/2017 13:47    Medications:  I have reviewed the patient's current medications. Scheduled: . aspirin EC  81 mg Oral Daily  . chlorhexidine gluconate (MEDLINE KIT)  15 mL Mouth Rinse BID  . dextrose  1 ampule Intravenous STAT  . famotidine  20 mg Per Tube Daily  . heparin  5,000 Units Subcutaneous Q8H  . hydrocortisone sod succinate (SOLU-CORTEF) inj  100 mg Intravenous Q8H  . mouth rinse  15 mL Mouth Rinse 10 times per day  . sodium chloride flush  3 mL Intravenous Q12H  . Tbo-filgastrim (GRANIX) SQ  480 mcg Subcutaneous Daily    Assessment/Plan: Patient now intubated and sedated.  Septic.  Scheduled for MRI of the brain later today.     LOS: 1 day   Alexis Goodell, MD Neurology [phone] 07/14/2017  12:05 PM

## 2017-07-15 ENCOUNTER — Inpatient Hospital Stay: Payer: Self-pay

## 2017-07-15 DIAGNOSIS — Z7189 Other specified counseling: Secondary | ICD-10-CM

## 2017-07-15 DIAGNOSIS — A4159 Other Gram-negative sepsis: Principal | ICD-10-CM

## 2017-07-15 DIAGNOSIS — Z515 Encounter for palliative care: Secondary | ICD-10-CM

## 2017-07-15 LAB — CBC WITH DIFFERENTIAL/PLATELET
BLASTS: 0 %
Band Neutrophils: 49 %
Basophils Absolute: 0 10*3/uL (ref 0–0.1)
Basophils Relative: 0 %
Eosinophils Absolute: 0.1 10*3/uL (ref 0–0.7)
Eosinophils Relative: 1 %
HEMATOCRIT: 28.8 % — AB (ref 40.0–52.0)
HEMOGLOBIN: 9.2 g/dL — AB (ref 13.0–18.0)
Lymphocytes Relative: 2 %
Lymphs Abs: 0.3 10*3/uL — ABNORMAL LOW (ref 1.0–3.6)
MCH: 32.5 pg (ref 26.0–34.0)
MCHC: 32.1 g/dL (ref 32.0–36.0)
MCV: 101.1 fL — ABNORMAL HIGH (ref 80.0–100.0)
Metamyelocytes Relative: 10 %
Monocytes Absolute: 0.1 10*3/uL — ABNORMAL LOW (ref 0.2–1.0)
Monocytes Relative: 1 %
Myelocytes: 2 %
NEUTROS PCT: 35 %
NRBC: 7 /100{WBCs} — AB
Neutro Abs: 12.8 10*3/uL — ABNORMAL HIGH (ref 1.4–6.5)
OTHER: 0 %
PROMYELOCYTES RELATIVE: 0 %
Platelets: 46 10*3/uL — ABNORMAL LOW (ref 150–440)
RBC: 2.85 MIL/uL — AB (ref 4.40–5.90)
RDW: 20.2 % — ABNORMAL HIGH (ref 11.5–14.5)
WBC: 13.3 10*3/uL — AB (ref 3.8–10.6)

## 2017-07-15 LAB — COMPREHENSIVE METABOLIC PANEL
ALBUMIN: 2 g/dL — AB (ref 3.5–5.0)
ALK PHOS: 75 U/L (ref 38–126)
ALT: 471 U/L — AB (ref 17–63)
AST: 561 U/L — ABNORMAL HIGH (ref 15–41)
Anion gap: 30 — ABNORMAL HIGH (ref 5–15)
BUN: 95 mg/dL — ABNORMAL HIGH (ref 6–20)
CALCIUM: 6.6 mg/dL — AB (ref 8.9–10.3)
CHLORIDE: 85 mmol/L — AB (ref 101–111)
CO2: 16 mmol/L — AB (ref 22–32)
CREATININE: 6.88 mg/dL — AB (ref 0.61–1.24)
GFR calc Af Amer: 9 mL/min — ABNORMAL LOW (ref >60)
GFR calc non Af Amer: 8 mL/min — ABNORMAL LOW (ref >60)
GLUCOSE: 247 mg/dL — AB (ref 65–99)
Potassium: 6.8 mmol/L (ref 3.5–5.1)
SODIUM: 131 mmol/L — AB (ref 135–145)
Total Bilirubin: 7.8 mg/dL — ABNORMAL HIGH (ref 0.3–1.2)
Total Protein: 5 g/dL — ABNORMAL LOW (ref 6.5–8.1)

## 2017-07-15 LAB — PREPARE FRESH FROZEN PLASMA
UNIT DIVISION: 0
Unit division: 0

## 2017-07-15 LAB — BPAM FFP
BLOOD PRODUCT EXPIRATION DATE: 201906092359
Blood Product Expiration Date: 201906092359
ISSUE DATE / TIME: 201906041305
ISSUE DATE / TIME: 201906041723
UNIT TYPE AND RH: 7300
Unit Type and Rh: 7300

## 2017-07-15 LAB — CK TOTAL AND CKMB (NOT AT ARMC)
CK, MB: 22.1 ng/mL — ABNORMAL HIGH (ref 0.5–5.0)
Relative Index: 3.2 — ABNORMAL HIGH (ref 0.0–2.5)
Total CK: 692 U/L — ABNORMAL HIGH (ref 49–397)

## 2017-07-15 LAB — GLUCOSE, CAPILLARY
GLUCOSE-CAPILLARY: 188 mg/dL — AB (ref 65–99)
Glucose-Capillary: 225 mg/dL — ABNORMAL HIGH (ref 65–99)

## 2017-07-15 LAB — HIV ANTIBODY (ROUTINE TESTING W REFLEX): HIV Screen 4th Generation wRfx: NONREACTIVE

## 2017-07-15 LAB — PROCALCITONIN: PROCALCITONIN: 91.41 ng/mL

## 2017-07-15 MED ORDER — SODIUM CHLORIDE 0.9 % IV SOLN
1.0000 mg/h | INTRAVENOUS | Status: DC
  Administered 2017-07-15: 1 mg/h via INTRAVENOUS
  Filled 2017-07-15: qty 5

## 2017-07-15 MED ORDER — LORAZEPAM 2 MG/ML IJ SOLN: 2.0000 mg | INTRAMUSCULAR | Status: DC

## 2017-07-15 MED ORDER — HALOPERIDOL LACTATE 5 MG/ML IJ SOLN
0.5000 mg | INTRAMUSCULAR | Status: DC | PRN
Start: 2017-07-15 — End: 2017-07-15

## 2017-07-15 MED ORDER — GLYCOPYRROLATE 0.2 MG/ML IJ SOLN: 0.2000 mg | INTRAMUSCULAR | Status: DC | PRN

## 2017-07-15 MED ORDER — LORAZEPAM 2 MG/ML IJ SOLN
INTRAMUSCULAR | Status: AC
  Filled 2017-07-15: qty 1

## 2017-07-15 MED ORDER — ASPIRIN 81 MG PO CHEW
81.0000 mg | CHEWABLE_TABLET | Freq: Every day | ORAL | Status: DC
  Administered 2017-07-15: 81 mg via ORAL
  Filled 2017-07-15: qty 1

## 2017-07-15 MED ORDER — INSULIN ASPART 100 UNIT/ML ~~LOC~~ SOLN
SUBCUTANEOUS | Status: AC
  Administered 2017-07-15: 3 [IU] via SUBCUTANEOUS
  Filled 2017-07-15: qty 1

## 2017-07-15 MED ORDER — SODIUM CHLORIDE 0.9 % IV SOLN
1.0000 g | Freq: Once | INTRAVENOUS | Status: DC
  Filled 2017-07-15: qty 10

## 2017-07-15 MED ORDER — HYDROMORPHONE BOLUS VIA INFUSION
1.0000 mg | INTRAVENOUS | Status: DC | PRN
  Filled 2017-07-15: qty 1

## 2017-07-15 MED ORDER — LORAZEPAM 2 MG/ML IJ SOLN: 2.0000 mg | INTRAMUSCULAR | Status: DC | PRN

## 2017-07-15 MED ORDER — SODIUM BICARBONATE 8.4 % IV SOLN
50.0000 meq | Freq: Once | INTRAVENOUS | Status: AC
  Administered 2017-07-15: 50 meq via INTRAVENOUS
  Filled 2017-07-15: qty 50

## 2017-07-15 MED ORDER — HYDROMORPHONE BOLUS VIA INFUSION: 1.0000 mg | INTRAVENOUS | Status: DC | PRN

## 2017-07-15 MED ORDER — INSULIN ASPART 100 UNIT/ML ~~LOC~~ SOLN
0.0000 [IU] | SUBCUTANEOUS | Status: DC
Start: 2017-07-15 — End: 2017-07-15
  Administered 2017-07-15: 3 [IU] via SUBCUTANEOUS

## 2017-07-16 LAB — CULTURE, BLOOD (ROUTINE X 2): Special Requests: ADEQUATE

## 2017-07-18 LAB — CULTURE, BLOOD (ROUTINE X 2): Culture: NO GROWTH

## 2017-07-20 ENCOUNTER — Telehealth: Payer: Self-pay

## 2017-07-20 NOTE — Telephone Encounter (Signed)
[employer] [facility] off Death Certificate to be completed and signed Placed in nurse box

## 2017-07-20 NOTE — Telephone Encounter (Signed)
Death certificate placed on DR desk for completion.

## 2017-07-21 NOTE — Telephone Encounter (Signed)
[employer]formed [employer] death cert is ready for pick up. Nothing further needed. 

## 2017-08-10 NOTE — Progress Notes (Addendum)
Follow up - Critical Care Medicine Note  Patient Details:    Marcus Romero is an 62 y.o. male.past medical history remarkable for prior CVA, hypertension and end-stage renal disease on hemodialysis,was brought into the emergency department after passing out during hemodialysis. Upon arrival into the intensive care unit he was immediately intubated, central line was placed. Overnight patient's acidosis has worsened, started on a bicarbonate infusion, is being sedated with propofol and is on norepinephrine. Presently on antibiotic coverage with vancomycin and Zosyn  Lines, Airways, Drains: Airway 7.5 mm (Active)  Secured at (cm) 22 cm 07/14/2017  7:20 AM  Measured From Lips 07/14/2017  7:20 AM  Secured Location Left 07/14/2017  7:20 AM  Secured By [employer] Tube Holder 07/14/2017  7:20 AM  Tube Holder Repositioned Yes 07/14/2017  7:20 AM  Cuff Pressure (cm H2O) 26 cm H2O 07/14/2017  7:20 AM  Site Condition Dry 07/14/2017  7:20 AM     CVC Triple Lumen 07/11/2017 Right Femoral (Active)  Indication for Insertion or Continuance of Line Vasoactive infusions 07/14/2017  8:00 AM  Site Assessment Clean;Dry;Intact 07/22/2017  8:00 PM  Proximal Lumen Status Infusing;Flushed;Blood return noted 07/20/2017  8:00 PM  Medial Lumen Status Flushed;Saline locked;Blood return noted 07/28/2017  8:00 PM  Distal Lumen Status Flushed;Saline locked;In-line blood sampling system in place 07/23/2017  8:00 PM  Dressing Type Transparent;Occlusive 08/01/2017  8:00 PM  Dressing Status Clean;Dry;Intact;Antimicrobial disc in place 07/18/2017  8:00 PM  Line Care Connections checked and tightened 07/25/2017  8:00 PM  Dressing Intervention New dressing 07/31/2017  5:45 PM  Dressing Change Due 07/20/17 07/29/2017  8:00 PM     NG/OG Tube Orogastric 16 Fr. Center mouth Xray Documented cm marking at nare/ corner of mouth 70 cm (Active)  Cm Marking at Nare/Corner of Mouth (if applicable) 70 cm 07/14/2017  4:00 AM  Site Assessment Clean;Dry;Intact 07/14/2017  4:00  AM  Ongoing Placement Verification No change in cm markings or external length of tube from initial placement;No change in respiratory status;No acute changes, not attributed to clinical condition;Xray 07/14/2017  4:00 AM  Status Suction-low intermittent 07/14/2017  4:00 AM  Amount of suction 80 mmHg 07/27/2017  5:35 PM  Drainage Appearance Brown;Clear 07/20/2017  5:35 PM  Output (mL) 50 mL 07/14/2017  4:00 AM    Anti-infectives:  Anti-infectives (From admission, onward)   Start     Dose/Rate Route Frequency Ordered Stop   07/14/17 0200  piperacillin-tazobactam (ZOSYN) IVPB 3.375 g     3.375 g 12.5 mL/hr over 240 Minutes Intravenous Every 12 hours 08/06/2017 1513     07/12/2017 1511  vancomycin (VANCOCIN) IVPB 750 mg/150 ml premix     750 mg 150 mL/hr over 60 Minutes Intravenous Every Dialysis 08/04/2017 1513     07/27/2017 1330  piperacillin-tazobactam (ZOSYN) IVPB 3.375 g     3.375 g 100 mL/hr over 30 Minutes Intravenous  Once 08/03/2017 1323 07/19/2017 1402   07/12/2017 1330  vancomycin (VANCOCIN) IVPB 1000 mg/200 mL premix     1,000 mg 200 mL/hr over 60 Minutes Intravenous  Once 07/27/2017 1323 07/19/2017 1440      Microbiology: Results for orders placed or performed during the hospital encounter of 08/03/2017  Culture, blood (Routine x 2)     Status: None (Preliminary result)   Collection Time: 07/28/2017  1:23 PM  Result Value Ref Range Status   Specimen Description BLOOD BLOOD RIGHT FOREARM  Final   Special Requests   Final    BOTTLES DRAWN AEROBIC AND ANAEROBIC  Blood Culture adequate volume   Culture  Setup Time   Final    Organism ID to follow GRAM NEGATIVE RODS IN BOTH AEROBIC AND ANAEROBIC BOTTLES CRITICAL RESULT CALLED TO, READ BACK BY AND VERIFIED WITH: DAVID BESANTI ON 07/14/17 AT 0026 JAG Performed at [facility], [address] [place], [place] [address]    Culture GRAM NEGATIVE RODS  Final   Report Status PENDING  Incomplete  Blood Culture ID Panel (Reflexed)     Status:  Abnormal   Collection Time: 07/23/2017  1:23 PM  Result Value Ref Range Status   Enterococcus species NOT DETECTED NOT DETECTED Final   Listeria monocytogenes NOT DETECTED NOT DETECTED Final   Staphylococcus species NOT DETECTED NOT DETECTED Final   Staphylococcus aureus NOT DETECTED NOT DETECTED Final   Streptococcus species NOT DETECTED NOT DETECTED Final   Streptococcus agalactiae NOT DETECTED NOT DETECTED Final   Streptococcus pneumoniae NOT DETECTED NOT DETECTED Final   Streptococcus pyogenes NOT DETECTED NOT DETECTED Final   Acinetobacter baumannii NOT DETECTED NOT DETECTED Final   Enterobacteriaceae species DETECTED (A) NOT DETECTED Final    Comment: Enterobacteriaceae represent a large family of gram-negative bacteria, not a single organism. CRITICAL RESULT CALLED TO, READ BACK BY AND VERIFIED WITH: DAVID BESANTI ON 07/14/17 AT 0026 JAG    Enterobacter cloacae complex NOT DETECTED NOT DETECTED Final   Escherichia coli NOT DETECTED NOT DETECTED Final   Klebsiella oxytoca NOT DETECTED NOT DETECTED Final   Klebsiella pneumoniae NOT DETECTED NOT DETECTED Final   Proteus species NOT DETECTED NOT DETECTED Final   Serratia marcescens DETECTED (A) NOT DETECTED Final    Comment: CRITICAL RESULT CALLED TO, READ BACK BY AND VERIFIED WITH: DAVID BESANTI ON 07/14/17 AT 0026 JAG    Carbapenem resistance NOT DETECTED NOT DETECTED Final   Haemophilus influenzae NOT DETECTED NOT DETECTED Final   Neisseria meningitidis NOT DETECTED NOT DETECTED Final   Pseudomonas aeruginosa NOT DETECTED NOT DETECTED Final   Candida albicans NOT DETECTED NOT DETECTED Final   Candida glabrata NOT DETECTED NOT DETECTED Final   Candida krusei NOT DETECTED NOT DETECTED Final   Candida parapsilosis NOT DETECTED NOT DETECTED Final   Candida tropicalis NOT DETECTED NOT DETECTED Final    Comment: Performed at [facility], [address] [place], [place] [address]  Culture, blood (Routine x 2)     Status:  None (Preliminary result)   Collection Time: 07/31/2017  3:06 PM  Result Value Ref Range Status   Specimen Description BLOOD EJ  Final   Special Requests   Final    BOTTLES DRAWN AEROBIC AND ANAEROBIC Blood Culture results may not be optimal due to an excessive volume of blood received in culture bottles   Culture   Final    NO GROWTH 2 DAYS Performed at [facility], [address] [place], [place] [address]    Report Status PENDING  Incomplete  MRSA PCR Screening     Status: None   Collection Time: 07/27/2017  6:16 PM  Result Value Ref Range Status   MRSA by PCR NEGATIVE NEGATIVE Final    Comment:        The GeneXpert MRSA Assay (FDA approved for NASAL specimens only), is one component of a comprehensive MRSA colonization surveillance program. It is not intended to diagnose MRSA infection nor to guide or monitor treatment for MRSA infections. Performed at [facility], [address] [place], [place] [address]     Best Practice/Protocols:  Heparin SQ  Studies: Dg Abd 1 View  Result Date: 07/28/2017 CLINICAL DATA:  MRI clearance. EXAM: ABDOMEN - 1 VIEW COMPARISON:  None. FINDINGS: Enteric tube in the stomach. Right femoral central venous catheter with tip in the lower IVC. The bowel gas pattern is normal. No radio-opaque calculi or other significant radiographic abnormality are seen. Diffuse vascular calcifications. No acute osseous abnormality. No radiopaque density in the abdomen or pelvis. IMPRESSION: No metallic density in the abdomen or pelvis. Electronically Signed   By: Obie DredgeWilliam T Derry M.D.   On: 07/22/2017 23:08   Ct Head Wo Contrast  Result Date: 07/31/2017 CLINICAL DATA:  Altered level of consciousness EXAM: CT HEAD WITHOUT CONTRAST TECHNIQUE: Contiguous axial images were obtained from the base of the skull through the vertex without intravenous contrast. COMPARISON:  None. FINDINGS: Brain: Moderate chronic microvascular disease throughout the deep  white matter. Chronic appearing bilateral basal ganglia and thalamic lacunar infarcts. No acute intracranial abnormality. Specifically, no hemorrhage, hydrocephalus, mass lesion, acute infarction, or significant intracranial injury. Vascular: No hyperdense vessel or unexpected calcification. Skull: No acute calvarial abnormality. Sinuses/Orbits: Visualized paranasal sinuses and mastoids clear. Orbital soft tissues unremarkable. Other: None IMPRESSION: Chronic microvascular disease. Chronic appearing bilateral basal ganglia and thalamic lacunar infarcts. No acute intracranial abnormality. Electronically Signed   By: Charlett NoseKevin  Dover M.D.   On: 08/09/2017 14:01   Mr Brain Wo Contrast  Result Date: 07/14/2017 CLINICAL DATA:  Initial evaluation for acute altered mental status. History of end-stage renal disease. EXAM: MRI HEAD WITHOUT CONTRAST TECHNIQUE: Multiplanar, multiecho pulse sequences of the brain and surrounding structures were obtained without intravenous contrast. COMPARISON:  Comparison made with prior CT from 07/22/2017. FINDINGS: Brain: Moderately advanced cerebral atrophy. Patchy and confluent T2/FLAIR hyperintensity within the periventricular and deep white matter both cerebral hemispheres most consistent with chronic small vessel ischemic change, moderate nature. Superimposed remote lacunar infarcts present within the periventricular white matter of the bilateral corona radiata. Additional scatter remote lacunar infarcts present within the bilateral basal ganglia and thalami as well as the pons and left midbrain. Patchy abnormal restricted diffusion seen involving the left cerebellar hemisphere (series 100, image 13, 9), consistent with acute ischemic infarcts. Additional patchy small volume acute ischemic infarcts at the superior right cerebellum (series 100, image 15). Small 8 mm acute ischemic infarct at the anterior right insula (series 100, image 25). No associated mass effect or hemorrhage. No  other evidence for acute ischemia. No mass lesion or midline shift. No hydrocephalus. No extra-axial fluid collection. Major dural sinuses grossly patent. Pituitary gland within normal limits.  Suprasellar region normal. Vascular: Major intracranial vascular flow voids are maintained. Skull and upper cervical spine: Craniocervical junction within normal limits. Degenerative disc osteophyte noted at C2-3 without significant stenosis. Bones demonstrate markedly hypointense T1 signal intensity, most anemia. No marrow replacing lesion. Scalp soft tissues unremarkable. To end-stage renal disease and chronic Sinuses/Orbits: Globes and orbital soft tissues within normal limits. Paranasal sinuses are largely clear. Patient is intubated. No significant mastoid effusion. Inner ear structures within normal limits. Other: None. IMPRESSION: 1. Multifocal acute ischemic nonhemorrhagic infarcts involving the left greater than right cerebellar hemispheres as well as the right insula. 2. Moderately advanced cerebral atrophy with chronic small vessel ischemic disease with multiple remote lacunar infarcts as above. Electronically Signed   By: Rise MuBenjamin  McClintock M.D.   On: 07/14/2017 14:57   Dg Chest Port 1 View  Result Date: 07/26/2017 CLINICAL DATA:  Intubation. EXAM: PORTABLE CHEST 1 VIEW COMPARISON:  Earlier today  1746 hours. FINDINGS: 1900 hours. Endotracheal tube has been retracted and terminates 2.9 cm above carina. Nasogastric tube looped in the stomach with tip at the proximal stomach. Midline trachea. Cardiomegaly accentuated by AP portable technique. Atherosclerosis in the transverse aorta. No right and no definite left pleural effusion. No pneumothorax. Low lung volumes with resultant pulmonary interstitial prominence. Overall improved aeration since the exam of earlier today. Persistent left base airspace disease and volume loss. IMPRESSION: Retraction of endotracheal tube, appropriately positioned 2.9 cm above  carina. Aortic Atherosclerosis (ICD10-I70.0). Improved aeration with persistent left base Airspace disease, likely atelectasis. Electronically Signed   By: Jeronimo Greaves M.D.   On: 24-Jul-2017 19:24   Dg Chest Port 1 View  Result Date: July 24, 2017 CLINICAL DATA:  62 year old male post endotracheal tube and nasogastric tube placement. Subsequent encounter. EXAM: PORTABLE CHEST 1 VIEW COMPARISON:  2017/07/24 1:14 p.m. chest x-ray. FINDINGS: Patient has been intubated. Tip is in the right mainstem bronchus. This needs to be retracted by 4 cm. Nasogastric tube curled within the gastric fundus. Cardiomegaly. Crowding lung markings versus atelectasis left lung base. Suspect coronary artery calcification. Calcified slightly tortuous aorta. IMPRESSION: Patient has been intubated. Tip is in the right mainstem bronchus. This needs to be retracted by 4 cm. Nasogastric tube curled within the gastric fundus. Cardiomegaly. Crowding lung markings versus atelectasis left lung base. Suspect coronary artery calcification. Aortic Atherosclerosis (ICD10-I70.0). These results were called by telephone at the time of interpretation on 07-24-17 at 6:43 pm to [place], patients nurse, who verbally acknowledged these results. Electronically Signed   By: Lacy Duverney M.D.   On: Jul 24, 2017 18:49   Dg Chest Port 1 View  Result Date: Jul 24, 2017 CLINICAL DATA:  Shortness of breath. Altered mental status EXAM: PORTABLE CHEST 1 VIEW COMPARISON:  None. FINDINGS: Cardiomegaly and coronary stenting. Aortic atherosclerosis. Low lung volumes. There is no edema, consolidation, effusion, or pneumothorax. IMPRESSION: No evidence of active disease. Cardiomegaly. Electronically Signed   By: Marnee Spring M.D.   On: 2017/07/24 13:47    Consults: Treatment Team:  Kym Groom, MD Pccm, Raymond Gurney, MD Earna Coder, MD Lamont Dowdy, MD Marcina Millard, MD Mick Sell, MD   Subjective:    Overnight Issues:  Patient's acidosis worsens, started on bicarbonate infusion, presently on norepinephrine  Objective:  Vital signs for last 24 hours: Temp:  [98.1 F (36.7 C)-99.1 F (37.3 C)] 98.4 F (36.9 C) (06/05 0748) Pulse Rate:  [66-96] 92 (06/05 0700) Resp:  [24-30] 27 (06/05 0700) BP: (77-117)/(54-85) 105/85 (06/05 0700) SpO2:  [85 %-100 %] 100 % (06/05 0700) FiO2 (%):  [30 %] 30 % (06/05 0227)  Hemodynamic parameters for last 24 hours:    Intake/Output from previous day: 06/04 0701 - 06/05 0700 In: 3296.8 [I.V.:2520.8; Blood:616; NG/GT:60; IV Piggyback:100] Out: 15 [Urine:15]  Intake/Output this shift: No intake/output data recorded.  Vent settings for last 24 hours: Vent Mode: PRVC FiO2 (%):  [30 %] 30 % Set Rate:  [16 bmp] 16 bmp Vt Set:  [500 mL] 500 mL PEEP:  [5 cmH20] 5 cmH20 Plateau Pressure:  [18 cmH20-20 cmH20] 20 cmH20  Physical Exam:  Vital signs: Please see the above listed vital signs HEENT, patient is orally intubated, trachea is midline, mild accessory muscle utilization Cardiovascular: Regular rate and rhythm with frequent ectopy, systolic murmur appreciated left parasternal border Pulmonary: Coarse rhonchi appreciated Abdominal: Positive bowel sounds, soft exam Extremities: Multiple areas of ulcerations, multiple wounds, blistering noted with serous drainage, being cultured, no distal pulses  B/L popliteal positive Neurologic: Patient is unresponsive, on propofol  Assessment/Plan:   Respiratory failure. Patient is encephalopathic remains on mechanical ventilation, at this time is not amenable will continue on protocol  Loss of consciousness.MRI of the brain was performed yesterday afternoon  IMPRESSION: 1. Multifocal acute ischemic nonhemorrhagic infarcts involving the left greater than right cerebellar hemispheres as well as the right insula. 2. Moderately advanced cerebral atrophy with chronic small vessel ischemic disease with multiple remote lacunar  infarcts as above.   End-stage renal failure. We'll touch base with nephrology today regarding hemodialysis  Septic shock. Patient is hypotensive, on norepinephrine, solucortef added. Patient has had multiple episodes of pauses and hemodynamic instability.  Leukopenia.white count has increased to 13.3, most likely reflected marrow suppression secondary to overwhelming sepsis. Patient has grown Serratia, presently on vancomycin and Zosyn as patient is at high likelihood for polymicrobial infection, pending infectious disease input  Coagulopathy. Patient's INR is elevated at 2.68, most likely reflects DIC, fibrinogen is 522 which may reflect acute phase reactant, hematology oncology has been consulted, patient not bleedingat this timewe'll hold on FFP, pending CBC from this morning  Lactic acidosis. Secondary to sepsis.   Elevated troponin. EKG reveals left bundle branch block, may be supply demand ischemia, will follow closely and may need cardiology consultation  Anemia. No evidence of active bleeding  Distal pulses absent, patient is not a therapeutic candidate as he is unstable and   Will need to discuss with family goals of care as patient is deteriorating. Palliative care consulted, called daughter pending return phone call  Critical Care Total Time 45  Endrit Gittins 17-Jul-2017  *Care during the described time interval was provided by me and/or other providers on the critical care team.  I have reviewed this patient's available data, including medical history, events of note, physical examination and test results as part of my evaluation. Patient ID: Marcus Romero, male   DOB: Jun 27, 1955, 62 y.o.   MRN: [id number]

## 2017-08-10 NOTE — Progress Notes (Signed)
Chaplain provided emotional support and prayer as family discussed end of life with Palliative Care. Chaplain will return at 13:00 when the patient's sister, Carley Hammedva, is scheduled to be present for the extubation.

## 2017-08-10 NOTE — Progress Notes (Signed)
Pt was suctioned prior to extubation for no secretions. He was extubated to comfort care.

## 2017-08-10 NOTE — Progress Notes (Signed)
Pt continues to have "pauses"/ asystole events on monitor, only able to doppler popliteal pulses. Dr's aware. Dr Wynelle LinkKolluru spoke with pts daughter about condition.

## 2017-08-10 NOTE — Progress Notes (Signed)
Family at bedside, PT extubated to comfort care. Palliative NP and Chaplain present.

## 2017-08-10 NOTE — Progress Notes (Signed)
Patient ID: Marcus LittenReginald Romero, male   DOB: 06/08/1955, 62 y.o.   MRN: [id number] Pulmonary / Critical Care Attending  Appreciate palliative care service and chaplain. Family is aware of end stage disease and severe sepsis With multisystem organ failure. Wish to move to comfort care approach. Pain and anxiety control. Palliative extubation  Tora KindredJohn Carmeron Heady, DO

## 2017-08-10 NOTE — Death Summary Note (Signed)
DEATH SUMMARY   Patient Details  Name: Marcus Romero MRN: [id number] DOB: 1955-12-07  Admission/Discharge Information   Admit Date:  08-03-17  Date of Death: Date of Death: 08/05/17  Time of Death: Time of Death: 1350  Length of Stay: 2  Referring Physician: Patient, No Pcp Per   Reason(s) for Hospitalization  Passed out during hemodialysis  Diagnoses  Preliminary cause of death:  Secondary Diagnoses (including complications and co-morbidities):  Active Problems:   Sepsis (HCC)   Sepsis due to Enterobacter [facility])   Terminal care   Palliative care by specialist   Advance care planning   Goals of care, counseling/discussion   Brief Hospital Course (including significant findings, care, treatment, and services provided and events leading to death)  Marcus Romero is a 62 y.o. year old male with a past medical history remarkable for prior CVA, hypertension and end-stage renal disease on hemodialysis,was brought into the emergency department after passing out during hemodialysis. Apparently he had 1-1/2 hours completed. CT scan of the head was performed which did not reveal any acute intracranial abnormality and revealed chronic basal ganglia and thalamic lacunar infarcts. Pertinent labs are remarkable for a lactic acid of 7.4, white count of 1, hemoglobin of 10.7, platelet count 141,bUN of 82 and serum creatinine of 6.69 with an anion gap of 18. Troponin was elevated at 2.07. EKG reveals left bundle branch block. Upon arrival into the intensive care unit he required immediate intubation, Central line placement and pressors. Of note his legs were covered with sores and bullous lesions bilaterally. He was seen by multiple consultants to include neurology, nephrology, palliative care, cardiology, oncology. Unfortunately his status deteriorated.   MRI was performed which revealed:   1. Multifocal acute ischemic nonhemorrhagic infarcts involving the left greater than right cerebellar  hemispheres as well as the right insula. 2. Moderately advanced cerebral atrophy with chronic small vessel ischemic disease with multiple remote lacunar infarcts as above.  He developed multiple system organ failure, known history of end-stage renal disease, respiratory failure, septic shock, blood cultures were positive for Serratia marcescens, ongoing pressors. Palliative care service was consult and after further discussions with all consulting services the family decided to move to comfort care with palliative extubation.  Patient deceased 1350 more pronounced by nurse      Pertinent Labs and Studies  Significant Diagnostic Studies Dg Abd 1 View  Result Date: 08-03-2017 CLINICAL DATA:  MRI clearance. EXAM: ABDOMEN - 1 VIEW COMPARISON:  None. FINDINGS: Enteric tube in the stomach. Right femoral central venous catheter with tip in the lower IVC. The bowel gas pattern is normal. No radio-opaque calculi or other significant radiographic abnormality are seen. Diffuse vascular calcifications. No acute osseous abnormality. No radiopaque density in the abdomen or pelvis. IMPRESSION: No metallic density in the abdomen or pelvis. Electronically Signed   By: Obie Dredge M.D.   On: 08/03/2017 23:08   Ct Head Wo Contrast  Result Date: 2017-08-03 CLINICAL DATA:  Altered level of consciousness EXAM: CT HEAD WITHOUT CONTRAST TECHNIQUE: Contiguous axial images were obtained from the base of the skull through the vertex without intravenous contrast. COMPARISON:  None. FINDINGS: Brain: Moderate chronic microvascular disease throughout the deep white matter. Chronic appearing bilateral basal ganglia and thalamic lacunar infarcts. No acute intracranial abnormality. Specifically, no hemorrhage, hydrocephalus, mass lesion, acute infarction, or significant intracranial injury. Vascular: No hyperdense vessel or unexpected calcification. Skull: No acute calvarial abnormality. Sinuses/Orbits: Visualized paranasal  sinuses and mastoids clear. Orbital soft tissues unremarkable. Other: None  IMPRESSION: Chronic microvascular disease. Chronic appearing bilateral basal ganglia and thalamic lacunar infarcts. No acute intracranial abnormality. Electronically Signed   By: Charlett NoseKevin  Dover M.D.   On: 07/27/2017 14:01   Mr Brain Wo Contrast  Result Date: 07/14/2017 CLINICAL DATA:  Initial evaluation for acute altered mental status. History of end-stage renal disease. EXAM: MRI HEAD WITHOUT CONTRAST TECHNIQUE: Multiplanar, multiecho pulse sequences of the brain and surrounding structures were obtained without intravenous contrast. COMPARISON:  Comparison made with prior CT from 07/14/2017. FINDINGS: Brain: Moderately advanced cerebral atrophy. Patchy and confluent T2/FLAIR hyperintensity within the periventricular and deep white matter both cerebral hemispheres most consistent with chronic small vessel ischemic change, moderate nature. Superimposed remote lacunar infarcts present within the periventricular white matter of the bilateral corona radiata. Additional scatter remote lacunar infarcts present within the bilateral basal ganglia and thalami as well as the pons and left midbrain. Patchy abnormal restricted diffusion seen involving the left cerebellar hemisphere (series 100, image 13, 9), consistent with acute ischemic infarcts. Additional patchy small volume acute ischemic infarcts at the superior right cerebellum (series 100, image 15). Small 8 mm acute ischemic infarct at the anterior right insula (series 100, image 25). No associated mass effect or hemorrhage. No other evidence for acute ischemia. No mass lesion or midline shift. No hydrocephalus. No extra-axial fluid collection. Major dural sinuses grossly patent. Pituitary gland within normal limits.  Suprasellar region normal. Vascular: Major intracranial vascular flow voids are maintained. Skull and upper cervical spine: Craniocervical junction within normal limits.  Degenerative disc osteophyte noted at C2-3 without significant stenosis. Bones demonstrate markedly hypointense T1 signal intensity, most anemia. No marrow replacing lesion. Scalp soft tissues unremarkable. To end-stage renal disease and chronic Sinuses/Orbits: Globes and orbital soft tissues within normal limits. Paranasal sinuses are largely clear. Patient is intubated. No significant mastoid effusion. Inner ear structures within normal limits. Other: None. IMPRESSION: 1. Multifocal acute ischemic nonhemorrhagic infarcts involving the left greater than right cerebellar hemispheres as well as the right insula. 2. Moderately advanced cerebral atrophy with chronic small vessel ischemic disease with multiple remote lacunar infarcts as above. Electronically Signed   By: Rise MuBenjamin  McClintock M.D.   On: 07/14/2017 14:57   Dg Chest Port 1 View  Result Date: 07/19/2017 CLINICAL DATA:  Prior CVA.  Patient is intubated. EXAM: PORTABLE CHEST 1 VIEW COMPARISON:  07/24/2017 FINDINGS: Stable position of endotracheal tube and enteric catheter. Cardiomediastinal silhouette is mildly enlarged. There is no evidence of pleural effusion or pneumothorax. Stable left lower lobe peribronchial airspace consolidation versus atelectasis. Osseous structures are without acute abnormality. Soft tissues are grossly normal. IMPRESSION: Low lung volumes. Stable left lower lobe peribronchial airspace consolidation versus atelectasis. Electronically Signed   By: Ted Mcalpineobrinka  Dimitrova M.D.   On: 04-30-2017 10:42   Dg Chest Port 1 View  Result Date: 07/16/2017 CLINICAL DATA:  Intubation. EXAM: PORTABLE CHEST 1 VIEW COMPARISON:  Earlier today 1746 hours. FINDINGS: 1900 hours. Endotracheal tube has been retracted and terminates 2.9 cm above carina. Nasogastric tube looped in the stomach with tip at the proximal stomach. Midline trachea. Cardiomegaly accentuated by AP portable technique. Atherosclerosis in the transverse aorta. No right and no  definite left pleural effusion. No pneumothorax. Low lung volumes with resultant pulmonary interstitial prominence. Overall improved aeration since the exam of earlier today. Persistent left base airspace disease and volume loss. IMPRESSION: Retraction of endotracheal tube, appropriately positioned 2.9 cm above carina. Aortic Atherosclerosis (ICD10-I70.0). Improved aeration with persistent left base Airspace disease, likely atelectasis. Electronically Signed  By: Jeronimo Greaves M.D.   On: 07/16/2017 19:24   Dg Chest Port 1 View  Result Date: 07/18/2017 CLINICAL DATA:  62 year old male post endotracheal tube and nasogastric tube placement. Subsequent encounter. EXAM: PORTABLE CHEST 1 VIEW COMPARISON:  07/22/2017 1:14 p.m. chest x-ray. FINDINGS: Patient has been intubated. Tip is in the right mainstem bronchus. This needs to be retracted by 4 cm. Nasogastric tube curled within the gastric fundus. Cardiomegaly. Crowding lung markings versus atelectasis left lung base. Suspect coronary artery calcification. Calcified slightly tortuous aorta. IMPRESSION: Patient has been intubated. Tip is in the right mainstem bronchus. This needs to be retracted by 4 cm. Nasogastric tube curled within the gastric fundus. Cardiomegaly. Crowding lung markings versus atelectasis left lung base. Suspect coronary artery calcification. Aortic Atherosclerosis (ICD10-I70.0). These results were called by telephone at the time of interpretation on 07/24/2017 at 6:43 pm to [place], patients nurse, who verbally acknowledged these results. Electronically Signed   By: Lacy Duverney M.D.   On: 08/04/2017 18:49   Dg Chest Port 1 View  Result Date: 07/16/2017 CLINICAL DATA:  Shortness of breath. Altered mental status EXAM: PORTABLE CHEST 1 VIEW COMPARISON:  None. FINDINGS: Cardiomegaly and coronary stenting. Aortic atherosclerosis. Low lung volumes. There is no edema, consolidation, effusion, or pneumothorax. IMPRESSION: No evidence of active  disease. Cardiomegaly. Electronically Signed   By: Marnee Spring M.D.   On: 07/31/2017 13:47    Microbiology Recent Results (from the past 240 hour(s))  Culture, blood (Routine x 2)     Status: Abnormal (Preliminary result)   Collection Time: 08/02/2017  1:23 PM  Result Value Ref Range Status   Specimen Description   Final    BLOOD BLOOD RIGHT FOREARM Performed at [facility], [address], [place], [place] [address]    Special Requests   Final    BOTTLES DRAWN AEROBIC AND ANAEROBIC Blood Culture adequate volume Performed at [facility], [address] [place], [place] [address]    Culture  Setup Time   Final    Organism ID to follow GRAM NEGATIVE RODS IN BOTH AEROBIC AND ANAEROBIC BOTTLES CRITICAL RESULT CALLED TO, READ BACK BY AND VERIFIED WITH: DAVID BESANTI ON 07/14/17 AT 0026 JAG Performed at [facility], [address], [place], [place] [address]    Culture (A)  Final    SERRATIA MARCESCENS SUSCEPTIBILITIES TO FOLLOW Performed at [facility], [address] [place], [place] [address]    Report Status PENDING  Incomplete  Blood Culture ID Panel (Reflexed)     Status: Abnormal   Collection Time: 07/17/2017  1:23 PM  Result Value Ref Range Status   Enterococcus species NOT DETECTED NOT DETECTED Final   Listeria monocytogenes NOT DETECTED NOT DETECTED Final   Staphylococcus species NOT DETECTED NOT DETECTED Final   Staphylococcus aureus NOT DETECTED NOT DETECTED Final   Streptococcus species NOT DETECTED NOT DETECTED Final   Streptococcus agalactiae NOT DETECTED NOT DETECTED Final   Streptococcus pneumoniae NOT DETECTED NOT DETECTED Final   Streptococcus pyogenes NOT DETECTED NOT DETECTED Final   Acinetobacter baumannii NOT DETECTED NOT DETECTED Final   Enterobacteriaceae species DETECTED (A) NOT DETECTED Final    Comment: Enterobacteriaceae represent a large family of gram-negative bacteria, not a single organism. CRITICAL RESULT  CALLED TO, READ BACK BY AND VERIFIED WITH: DAVID BESANTI ON 07/14/17 AT 0026 JAG    Enterobacter cloacae complex NOT DETECTED NOT DETECTED Final   Escherichia coli NOT DETECTED NOT DETECTED Final  Klebsiella oxytoca NOT DETECTED NOT DETECTED Final   Klebsiella pneumoniae NOT DETECTED NOT DETECTED Final   Proteus species NOT DETECTED NOT DETECTED Final   Serratia marcescens DETECTED (A) NOT DETECTED Final    Comment: CRITICAL RESULT CALLED TO, READ BACK BY AND VERIFIED WITH: DAVID BESANTI ON 07/14/17 AT 0026 JAG    Carbapenem resistance NOT DETECTED NOT DETECTED Final   Haemophilus influenzae NOT DETECTED NOT DETECTED Final   Neisseria meningitidis NOT DETECTED NOT DETECTED Final   Pseudomonas aeruginosa NOT DETECTED NOT DETECTED Final   Candida albicans NOT DETECTED NOT DETECTED Final   Candida glabrata NOT DETECTED NOT DETECTED Final   Candida krusei NOT DETECTED NOT DETECTED Final   Candida parapsilosis NOT DETECTED NOT DETECTED Final   Candida tropicalis NOT DETECTED NOT DETECTED Final    Comment: Performed at [facility], [address] [place], [place] [address]  Culture, blood (Routine x 2)     Status: None (Preliminary result)   Collection Time: 07/20/2017  3:06 PM  Result Value Ref Range Status   Specimen Description BLOOD EJ  Final   Special Requests   Final    BOTTLES DRAWN AEROBIC AND ANAEROBIC Blood Culture results may not be optimal due to an excessive volume of blood received in culture bottles   Culture   Final    NO GROWTH 2 DAYS Performed at [facility], [address] [place], [place] [address]    Report Status PENDING  Incomplete  MRSA PCR Screening     Status: None   Collection Time: 07/23/2017  6:16 PM  Result Value Ref Range Status   MRSA by PCR NEGATIVE NEGATIVE Final    Comment:        The GeneXpert MRSA Assay (FDA approved for NASAL specimens only), is one component of a comprehensive MRSA colonization surveillance program. It is  not intended to diagnose MRSA infection nor to guide or monitor treatment for MRSA infections. Performed at [facility], [address] [place], [place] [address]     Lab Basic Metabolic Panel: Recent Labs  Lab 07/25/2017 1323 07/31/2017 1826 07/14/17 0353 07/14/17 0907 08/13/17 0853  NA 138 134* 140  --  131*  K 4.5 4.8 4.7  --  6.8*  CL 96* 96* 92*  --  85*  CO2 24 14* 16*  --  16*  GLUCOSE 64* 207* 161*  --  247*  BUN 82* 74* 81*  --  95*  CREATININE 6.69* 6.75* 7.07* 1.00 6.88*  CALCIUM 8.5* 7.6* 7.5*  --  6.6*  MG  --   --  2.0  --   --   PHOS  --   --  7.8*  --   --    Liver Function Tests: Recent Labs  Lab 08/02/2017 1323 07/29/2017 1826 August 13, 2017 0853  AST 52* 68* 561*  ALT 34 34 471*  ALKPHOS 139* 120 75  BILITOT 4.9* 4.3* 7.8*  PROT 6.7 5.4* 5.0*  ALBUMIN 2.6* 2.0* 2.0*   No results for input(s): LIPASE, AMYLASE in the last 168 hours. No results for input(s): AMMONIA in the last 168 hours. CBC: Recent Labs  Lab 07/16/2017 1323 08/09/2017 1826 07/14/17 0840 Aug 13, 2017 0443  WBC 1.0* 1.8* 4.3 13.3*  NEUTROABS 0.8*  --  2.5 12.8*  HGB 10.7* 9.5* 9.8* 9.2*  HCT 32.8* 31.1* 31.2* 28.8*  MCV 100.6* 105.9* 102.6* 101.1*  PLT 141* 117* 75* 46*   Cardiac Enzymes: Recent Labs  Lab 07/31/2017 1929 07/14/17  0353 07/14/17 0840 07/14/17 1528 07/14/17 2015 08/02/17 0253  CKTOTAL  --   --   --   --   --  692*  CKMB  --   --   --   --   --  22.1*  TROPONINI 1.92* 2.20* 2.96* 3.73* 3.97*  --    Sepsis Labs: Recent Labs  Lab 07/28/2017 1323 07/14/2017 1506 07/20/2017 1820 08/04/2017 1826 07/14/17 0353 07/14/17 0840 07/14/17 1528 2017-08-02 0443  PROCALCITON  --   --  71.26  --  85.23  --   --  91.41  WBC 1.0*  --   --  1.8*  --  4.3  --  13.3*  LATICACIDVEN 5.5* 7.4*  --   --   --   --  13.0*  --     Procedures/Operations  MRI Intubation Central Line   Cleveland Paiz 08/02/2017, 2:59 PM

## 2017-08-10 NOTE — Progress Notes (Signed)
Nutrition Brief Note  Chart reviewed and discussed on interdisciplinary rounds. Patient will be transitioning to comfort care today.  No nutrition interventions warranted at this time. Please consult RD as needed.  Helane RimaLeanne Mikeal Winstanley, MS, RD, LDN Office: [phone] Pager: [phone] After Hours/Weekend Pager: [phone]

## 2017-08-10 NOTE — Progress Notes (Signed)
Continuing pastoral care for the family and staff. Pastoral [facility].

## 2017-08-10 NOTE — Progress Notes (Signed)
Palliative NP spoke with daughter on phone, in process of transitioning to comfort care

## 2017-08-10 NOTE — Progress Notes (Signed)
   07/17/2017 0945  Clinical Encounter Type  Visited With Patient  Visit Type Critical Care;Patient actively dying  Referral From Nurse  Consult/Referral To Chaplain  Spiritual Encounters  Spiritual Needs Prayer   CH received a PG from ICU that PT's health continued to weaken and CH presences was requested. CH reported to PT's RM and prayed silently for PT. NO family was present. CH will follow up as needed.

## 2017-08-10 NOTE — Progress Notes (Signed)
Patient critically ill-second respiratory failure/multiorgan dysfunction.  Discontinue Granix.  Discussed with nursing.

## 2017-08-10 NOTE — Progress Notes (Signed)
[facility]  ROUNDING NOTE   Subjective:   Asystolic events on telemetry overnight.  Transitioned to norepinephrine gtt. Off dopamine.   2 units FFP   D10 at 3m/hr Sodium bicarb at 771mhr  PRVC 30%  Blood cultures enterobacter and serratia species.   Objective:  Vital signs in last 24 hours:  Temp:  [98.1 F (36.7 C)-99.1 F (37.3 C)] 98.4 F (36.9 C) (06/05 0748) Pulse Rate:  [66-96] 92 (06/05 0700) Resp:  [24-30] 27 (06/05 0700) BP: (77-117)/(54-85) 105/85 (06/05 0700) SpO2:  [85 %-100 %] 100 % (06/05 0700) FiO2 (%):  [30 %] 30 % (06/05 0227)  Weight change:  Filed Weights   07/14/2017 1402  Weight: 86.2 kg (190 lb)    Intake/Output: I/O last 3 completed shifts: In: 4990.7 [I.V.:4214.7; Blood:616; NG/GT:60; IV Piggyback:100] Out: 65 [Urine:15; Emesis/NG output:50]   Intake/Output this shift:  No intake/output data recorded.  Physical Exam: General: Critically ill  Head: ETT, OGT  Eyes: Pupils not reactive to light  Neck: trachea midline  Lungs:  PRVC 30%  Heart: irregular  Abdomen:  Soft, nontender,   Extremities: Bullous edema, chronic bilateral lower extremity ulcers  Neurologic: Sedated and intubated  Skin: No lesions  Access: Left AVF    Basic Metabolic Panel: Recent Labs  Lab 08/01/2017 1323 07/20/2017 1826 07/14/17 0353 07/14/17 0907  NA 138 134* 140  --   K 4.5 4.8 4.7  --   CL 96* 96* 92*  --   CO2 24 14* 16*  --   GLUCOSE 64* 207* 161*  --   BUN 82* 74* 81*  --   CREATININE 6.69* 6.75* 7.07* 1.00  CALCIUM 8.5* 7.6* 7.5*  --   MG  --   --  2.0  --   PHOS  --   --  7.8*  --     Liver Function Tests: Recent Labs  Lab 07/29/2017 1323 07/12/2017 1826  AST 52* 68*  ALT 34 34  ALKPHOS 139* 120  BILITOT 4.9* 4.3*  PROT 6.7 5.4*  ALBUMIN 2.6* 2.0*   No results for input(s): LIPASE, AMYLASE in the last 168 hours. No results for input(s): AMMONIA in the last 168 hours.  CBC: Recent Labs  Lab 08/03/2017 1323  07/18/2017 1826 07/14/17 0840 06June 06, [id number]  WBC 1.0* 1.8* 4.3 13.3*  NEUTROABS 0.8*  --  2.5 12.8*  HGB 10.7* 9.5* 9.8* 9.2*  HCT 32.8* 31.1* 31.2* 28.8*  MCV 100.6* 105.9* 102.6* 101.1*  PLT 141* 117* 75* 46*    Cardiac Enzymes: Recent Labs  Lab 07/18/2017 1929 07/14/17 0353 07/14/17 0840 07/14/17 1528 07/14/17 2015 06Jun 06, 2019253  CKTOTAL  --   --   --   --   --  692*  CKMB  --   --   --   --   --  22.1*  TROPONINI 1.92* 2.20* 2.96* 3.73* 3.97*  --     BNP: Invalid input(s): POCBNP  CBG: Recent Labs  Lab 07/14/17 1619 07/14/17 1959 07/14/17 2349 [id number]  GLUCAP 115* 143* [id number]*    Microbiology: Results for orders placed or performed during the hospital encounter of 07/25/2017  Culture, blood (Routine x 2)     Status: None (Preliminary result)   Collection Time: 08/03/2017  1:23 PM  Result Value Ref Range Status   Specimen Description BLOOD BLOOD RIGHT FOREARM  Final   Special Requests   Final    BOTTLES DRAWN AEROBIC AND ANAEROBIC Blood Culture adequate  volume   Culture  Setup Time   Final    Organism ID to follow GRAM NEGATIVE RODS IN BOTH AEROBIC AND ANAEROBIC BOTTLES CRITICAL RESULT CALLED TO, READ BACK BY AND VERIFIED WITH: DAVID BESANTI ON 07/14/17 AT 0026 JAG Performed at [facility], [address] [place], [place] [address]    Culture GRAM NEGATIVE RODS  Final   Report Status PENDING  Incomplete  Blood Culture ID Panel (Reflexed)     Status: Abnormal   Collection Time: 07/23/2017  1:23 PM  Result Value Ref Range Status   Enterococcus species NOT DETECTED NOT DETECTED Final   Listeria monocytogenes NOT DETECTED NOT DETECTED Final   Staphylococcus species NOT DETECTED NOT DETECTED Final   Staphylococcus aureus NOT DETECTED NOT DETECTED Final   Streptococcus species NOT DETECTED NOT DETECTED Final   Streptococcus agalactiae NOT DETECTED NOT DETECTED Final   Streptococcus pneumoniae NOT DETECTED NOT DETECTED Final    Streptococcus pyogenes NOT DETECTED NOT DETECTED Final   Acinetobacter baumannii NOT DETECTED NOT DETECTED Final   Enterobacteriaceae species DETECTED (A) NOT DETECTED Final    Comment: Enterobacteriaceae represent a large family of gram-negative bacteria, not a single organism. CRITICAL RESULT CALLED TO, READ BACK BY AND VERIFIED WITH: DAVID BESANTI ON 07/14/17 AT 0026 JAG    Enterobacter cloacae complex NOT DETECTED NOT DETECTED Final   Escherichia coli NOT DETECTED NOT DETECTED Final   Klebsiella oxytoca NOT DETECTED NOT DETECTED Final   Klebsiella pneumoniae NOT DETECTED NOT DETECTED Final   Proteus species NOT DETECTED NOT DETECTED Final   Serratia marcescens DETECTED (A) NOT DETECTED Final    Comment: CRITICAL RESULT CALLED TO, READ BACK BY AND VERIFIED WITH: DAVID BESANTI ON 07/14/17 AT 0026 JAG    Carbapenem resistance NOT DETECTED NOT DETECTED Final   Haemophilus influenzae NOT DETECTED NOT DETECTED Final   Neisseria meningitidis NOT DETECTED NOT DETECTED Final   Pseudomonas aeruginosa NOT DETECTED NOT DETECTED Final   Candida albicans NOT DETECTED NOT DETECTED Final   Candida glabrata NOT DETECTED NOT DETECTED Final   Candida krusei NOT DETECTED NOT DETECTED Final   Candida parapsilosis NOT DETECTED NOT DETECTED Final   Candida tropicalis NOT DETECTED NOT DETECTED Final    Comment: Performed at [facility], [address] [place], [place] [address]  Culture, blood (Routine x 2)     Status: None (Preliminary result)   Collection Time: 07/12/2017  3:06 PM  Result Value Ref Range Status   Specimen Description BLOOD EJ  Final   Special Requests   Final    BOTTLES DRAWN AEROBIC AND ANAEROBIC Blood Culture results may not be optimal due to an excessive volume of blood received in culture bottles   Culture   Final    NO GROWTH 2 DAYS Performed at [facility], [address] [place], [place] [address]    Report Status PENDING  Incomplete  MRSA PCR  Screening     Status: None   Collection Time: 07/12/2017  6:16 PM  Result Value Ref Range Status   MRSA by PCR NEGATIVE NEGATIVE Final    Comment:        The GeneXpert MRSA Assay (FDA approved for NASAL specimens only), is one component of a comprehensive MRSA colonization surveillance program. It is not intended to diagnose MRSA infection nor to guide or monitor treatment for MRSA infections. Performed at [facility], [address] [place],  [address]     Coagulation Studies: Recent Labs  07/28/2017 1323 07/14/17 0353  LABPROT 19.4* 28.3*  INR 1.65 2.68    Urinalysis: No results for input(s): COLORURINE, LABSPEC, PHURINE, GLUCOSEU, HGBUR, BILIRUBINUR, KETONESUR, PROTEINUR, UROBILINOGEN, NITRITE, LEUKOCYTESUR in the last 72 hours.  Invalid input(s): APPERANCEUR    Imaging: Dg Abd 1 View  Result Date: 07/11/2017 CLINICAL DATA:  MRI clearance. EXAM: ABDOMEN - 1 VIEW COMPARISON:  None. FINDINGS: Enteric tube in the stomach. Right femoral central venous catheter with tip in the lower IVC. The bowel gas pattern is normal. No radio-opaque calculi or other significant radiographic abnormality are seen. Diffuse vascular calcifications. No acute osseous abnormality. No radiopaque density in the abdomen or pelvis. IMPRESSION: No metallic density in the abdomen or pelvis. Electronically Signed   By: Titus Dubin M.D.   On: 07/22/2017 23:08   Ct Head Wo Contrast  Result Date: 08/08/2017 CLINICAL DATA:  Altered level of consciousness EXAM: CT HEAD WITHOUT CONTRAST TECHNIQUE: Contiguous axial images were obtained from the base of the skull through the vertex without intravenous contrast. COMPARISON:  None. FINDINGS: Brain: Moderate chronic microvascular disease throughout the deep white matter. Chronic appearing bilateral basal ganglia and thalamic lacunar infarcts. No acute intracranial abnormality. Specifically, no hemorrhage, hydrocephalus, mass lesion, acute  infarction, or significant intracranial injury. Vascular: No hyperdense vessel or unexpected calcification. Skull: No acute calvarial abnormality. Sinuses/Orbits: Visualized paranasal sinuses and mastoids clear. Orbital soft tissues unremarkable. Other: None IMPRESSION: Chronic microvascular disease. Chronic appearing bilateral basal ganglia and thalamic lacunar infarcts. No acute intracranial abnormality. Electronically Signed   By: Rolm Baptise M.D.   On: 08/06/2017 14:01   Mr Brain Wo Contrast  Result Date: 07/14/2017 CLINICAL DATA:  Initial evaluation for acute altered mental status. History of end-stage renal disease. EXAM: MRI HEAD WITHOUT CONTRAST TECHNIQUE: Multiplanar, multiecho pulse sequences of the brain and surrounding structures were obtained without intravenous contrast. COMPARISON:  Comparison made with prior CT from 07/29/2017. FINDINGS: Brain: Moderately advanced cerebral atrophy. Patchy and confluent T2/FLAIR hyperintensity within the periventricular and deep white matter both cerebral hemispheres most consistent with chronic small vessel ischemic change, moderate nature. Superimposed remote lacunar infarcts present within the periventricular white matter of the bilateral corona radiata. Additional scatter remote lacunar infarcts present within the bilateral basal ganglia and thalami as well as the pons and left midbrain. Patchy abnormal restricted diffusion seen involving the left cerebellar hemisphere (series 100, image 13, 9), consistent with acute ischemic infarcts. Additional patchy small volume acute ischemic infarcts at the superior right cerebellum (series 100, image 15). Small 8 mm acute ischemic infarct at the anterior right insula (series 100, image 25). No associated mass effect or hemorrhage. No other evidence for acute ischemia. No mass lesion or midline shift. No hydrocephalus. No extra-axial fluid collection. Major dural sinuses grossly patent. Pituitary gland within normal  limits.  Suprasellar region normal. Vascular: Major intracranial vascular flow voids are maintained. Skull and upper cervical spine: Craniocervical junction within normal limits. Degenerative disc osteophyte noted at C2-3 without significant stenosis. Bones demonstrate markedly hypointense T1 signal intensity, most anemia. No marrow replacing lesion. Scalp soft tissues unremarkable. To end-stage renal disease and chronic Sinuses/Orbits: Globes and orbital soft tissues within normal limits. Paranasal sinuses are largely clear. Patient is intubated. No significant mastoid effusion. Inner ear structures within normal limits. Other: None. IMPRESSION: 1. Multifocal acute ischemic nonhemorrhagic infarcts involving the left greater than right cerebellar hemispheres as well as the right insula. 2. Moderately advanced cerebral atrophy with chronic small vessel ischemic disease with multiple remote lacunar infarcts  as above. Electronically Signed   By: Jeannine Boga M.D.   On: 07/14/2017 14:57   Dg Chest Port 1 View  Result Date: 07/24/2017 CLINICAL DATA:  Intubation. EXAM: PORTABLE CHEST 1 VIEW COMPARISON:  Earlier today 1746 hours. FINDINGS: 1900 hours. Endotracheal tube has been retracted and terminates 2.9 cm above carina. Nasogastric tube looped in the stomach with tip at the proximal stomach. Midline trachea. Cardiomegaly accentuated by AP portable technique. Atherosclerosis in the transverse aorta. No right and no definite left pleural effusion. No pneumothorax. Low lung volumes with resultant pulmonary interstitial prominence. Overall improved aeration since the exam of earlier today. Persistent left base airspace disease and volume loss. IMPRESSION: Retraction of endotracheal tube, appropriately positioned 2.9 cm above carina. Aortic Atherosclerosis (ICD10-I70.0). Improved aeration with persistent left base Airspace disease, likely atelectasis. Electronically Signed   By: Abigail Miyamoto M.D.   On: 08/07/2017  19:24   Dg Chest Port 1 View  Result Date: 07/12/2017 CLINICAL DATA:  62 year old male post endotracheal tube and nasogastric tube placement. Subsequent encounter. EXAM: PORTABLE CHEST 1 VIEW COMPARISON:  08/08/2017 1:14 p.m. chest x-ray. FINDINGS: Patient has been intubated. Tip is in the right mainstem bronchus. This needs to be retracted by 4 cm. Nasogastric tube curled within the gastric fundus. Cardiomegaly. Crowding lung markings versus atelectasis left lung base. Suspect coronary artery calcification. Calcified slightly tortuous aorta. IMPRESSION: Patient has been intubated. Tip is in the right mainstem bronchus. This needs to be retracted by 4 cm. Nasogastric tube curled within the gastric fundus. Cardiomegaly. Crowding lung markings versus atelectasis left lung base. Suspect coronary artery calcification. Aortic Atherosclerosis (ICD10-I70.0). These results were called by telephone at the time of interpretation on 08/05/2017 at 6:43 pm to [place], patients nurse, who verbally acknowledged these results. Electronically Signed   By: Genia Del M.D.   On: 08/02/2017 18:49   Dg Chest Port 1 View  Result Date: 07/29/2017 CLINICAL DATA:  Shortness of breath. Altered mental status EXAM: PORTABLE CHEST 1 VIEW COMPARISON:  None. FINDINGS: Cardiomegaly and coronary stenting. Aortic atherosclerosis. Low lung volumes. There is no edema, consolidation, effusion, or pneumothorax. IMPRESSION: No evidence of active disease. Cardiomegaly. Electronically Signed   By: Monte Fantasia M.D.   On: 07/24/2017 13:47     Medications:   . sodium chloride    . dextrose 50 mL/hr at 07-29-17 0600  . norepinephrine (LEVOPHED) '16mg'$  / 272m infusion 8 mcg/min (006-19-190600)  . piperacillin-tazobactam (ZOSYN)  IV Stopped (0June 19, [id number])  . propofol (DIPRIVAN) infusion 19.915 mcg/kg/min (006/19/190600)  .  sodium bicarbonate  infusion 1000 mL 75 mL/hr at [id number]  . vancomycin     . aspirin EC  81 mg Oral  Daily  . chlorhexidine gluconate (MEDLINE KIT)  15 mL Mouth Rinse BID  . famotidine  20 mg Per Tube Daily  . heparin  5,000 Units Subcutaneous Q8H  . hydrocortisone sod succinate (SOLU-CORTEF) inj  100 mg Intravenous Q8H  . mouth rinse  15 mL Mouth Rinse 10 times per day  . sodium chloride flush  3 mL Intravenous Q12H  . white petrolatum   Topical Daily   sodium chloride, acetaminophen **OR** acetaminophen, fentaNYL (SUBLIMAZE) injection, fentaNYL (SUBLIMAZE) injection, HYDROcodone-acetaminophen, midazolam, midazolam, ondansetron **OR** ondansetron (ZOFRAN) IV, senna-docusate, sodium chloride flush, vancomycin  Assessment/ Plan:  Mr. RTameem Pullarais a 62y.o. black male Mr. RIzzy Courvilleis a 62y.o. black male with end stage renal disease on hemodialysis, diabetes mellitus type II insulin dependent, hypertension,  anemia, hyperlipidemia, BPH, CVA with residual right sided weakness, coronary artery disease, who was admitted to [facility] on 08/02/2017 for Sepsis, due to unspecified organism ([place]) [A41.9] Altered mental status, unspecified altered mental status type [R41.82] Leukopenia, unspecified type [D72.819] Sepsis ([place]) [A41.9]  [facility] MWF Left AVF   1. End Stage Renal Disease: with metabolic acidosis. Hemodynamically unstable.  At this time, patient's daughter is not considering aggressive measures Pending CMP.  Continous renal replacement therapy if indicated however with overall poor prognosis - Continue bicarb gtt  2. Hypotension/sepsis: with pancytopenia. Multiple organisms on blood culture - Empiric pip/tazo and vanco - stress dosed steroids - requiring vasopressors: norepinephrine gtt  3. Altered mental status: MRI reviewed with family.  - Appreciate neurology input.   4. Respiratory failure: intubated and sedated.  - Appreciate pulm input.   5. Anemia with chronic kidney disease: with pancytopenia - Appreciate heme/onc input.  - Granix - EPO as  needed.   Overall prognosis is grim. Phone conversation with daughter, "[name]" Patient to be a DNR. No aggressive measures.      LOS: 2 Drea Jurewicz 06/12/198:33 AM

## 2017-08-10 NOTE — Progress Notes (Signed)
Pt Passed at 1350. Dr Lonn Georgiaonforti and Palliative aware. CDS notified, pt not a donor. Family remained at bedside.

## 2017-08-10 NOTE — Consult Note (Signed)
Consultation Note Date: 08-03-2017   Patient Name: Marcus Romero  DOB: [birth year]  MRN: [id number]  Age / Sex: 62 y.o., male  PCP: Patient, No Pcp Per Referring Physician: Bettey Costa, MD  Reason for Consultation: Establishing goals of care, Terminal Care and Withdrawal of life-sustaining treatment  HPI/Patient Profile: 62 y.o. male  with past medical history of ESRD on HD, CVA, HTN  admitted on 07/18/2017 after syncopal episode in HD. During admission he has been found to be septic with serratia and enterobacter bacteremia, requiring intubationl, IV pressors. Has been having episodes of asystole despite current interventions. Palliative medicine consulted for [place].  Clinical Assessment and Goals of Care: Evaluated patient, discussed case with patient's attending physician- patient appears to be actively dying even while on current life supportive measures.   Patients has multiple open bleeding wounds on lower thighs and legs. Discussed options of continued aggressive medical care vs transition to comfort care and allowing for peaceful natural death with patient's daughterAndi Devon. She very quickly stated she felt that her Dad was dying and wished for him to be transitioned to comfort care and liberated from ventilator. Patient sister- [name] also arrived and stated that she agreed that patient would desire comfort GOC, deescalation from aggressive medical care and one way extubation. GOC are anticipated natural death with symptom support.   Patient's daughter and sister stated they were familiar with the process of extubation and transition to comfort as they had gone through this with several family members.  [name] noted that her father had not raised her, however, she was still very emotional regarding the situation.  [name] has lost several brothers in the last several years.     Primary Decision Maker NEXT OF  KIN- daughter- [name]    SUMMARY OF RECOMMENDATIONS/OUTCOME -Patient was transitioned to comfort care with hydromorphone infusion, prn lorazepam  -All lifeprolonging interventions including medications, IV fluids, cardiac monitoring, etc. were dc'd    -Extubation was carried out without complication- patient tolerated well and died comfortably within minutes of extubation, I remained at bedside with family during extubation, Chaplain was also present  Code Status/Advance Care Planning:  DNR   Additional Recommendations (Limitations, Scope, Preferences):  Full Comfort Care  Psycho-social/Spiritual:   Desire for further Chaplaincy support:yes  Prognosis:    Hours - Days  Discharge Planning: Anticipated Hospital Death  Primary Diagnoses: Present on Admission: . Sepsis ([place])   I have reviewed the medical record, interviewed the patient and family, and examined the patient. The following aspects are pertinent.  Past Medical History:  Diagnosis Date  . CVA (cerebral vascular accident) ([place])   . ESRD on dialysis ([place])   . Hypertension    Social History   Socioeconomic History  . Marital status: Married    Spouse name: Not on file  . Number of children: Not on file  . Years of education: Not on file  . Highest education level: Not on file  Occupational History  . Not on file  Social Needs  . Emergency planning/management officer  strain: Not on file  . Food insecurity:    Worry: Not on file    Inability: Not on file  . Transportation needs:    Medical: Not on file    Non-medical: Not on file  Tobacco Use  . Smoking status: Not on file  Substance and Sexual Activity  . Alcohol use: Not on file  . Drug use: Not on file  . Sexual activity: Not on file  Lifestyle  . Physical activity:    Days per week: Not on file    Minutes per session: Not on file  . Stress: Not on file  Relationships  . Social connections:    Talks on phone: Not on file    Gets together: Not on file     Attends religious service: Not on file    Active member of club or organization: Not on file    Attends meetings of clubs or organizations: Not on file    Relationship status: Not on file  Other Topics Concern  . Not on file  Social History Narrative  . Not on file   No family history on file. Scheduled Meds: . chlorhexidine gluconate (MEDLINE KIT)  15 mL Mouth Rinse BID  . LORazepam  2 mg Intravenous NOW  . white petrolatum   Topical Daily   Continuous Infusions: . HYDROmorphone 1 mg/hr (08-10-17 1202)   PRN Meds:.glycopyrrolate, haloperidol lactate, HYDROmorphone, LORazepam Medications Prior to Admission:  Prior to Admission medications   Medication Sig Start Date End Date Taking? Authorizing Provider  carvedilol (COREG) 25 MG tablet Take 25 mg by mouth 2 (two) times daily. 07/09/17  Yes [provider]  insulin NPH Human (HUMULIN N) 100 UNIT/ML injection Inject 15 Units into the skin 2 (two) times daily. 04/23/17 04/23/18 Yes [provider]  lisinopril (PRINIVIL,ZESTRIL) 40 MG tablet Take 40 mg by mouth daily. 05/17/17  Yes [provider]  loperamide (IMODIUM) 2 MG capsule Take 2 mg by mouth 3 (three) times daily as needed. 05/18/17  Yes [provider]   Not on File Review of Systems  Unable to perform ROS: Patient unresponsive    Physical Exam  Constitutional: He appears well-developed and well-nourished.  Cardiovascular:  tachycardic  Pulmonary/Chest:  intubated  Neurological:  Not responsive  Nursing note and vitals reviewed.   Vital Signs: BP 108/88   Pulse 79   Temp 98.4 F (36.9 C) (Axillary)   Resp (!) 28   Ht '5\' 2"'$  (1.575 m)   Wt 86.2 kg (190 lb)   SpO2 100%   BMI 34.75 kg/m  Pain Scale: CPOT   Pain Score: 0-No pain   SpO2: SpO2: 100 % O2 Device:SpO2: 100 % O2 Flow Rate: .   IO: Intake/output summary:   Intake/Output Summary (Last 24 hours) at August 10, 2017 1338 Last data filed at 2017-08-10 1000 Gross per 24 hour    Intake 3185.92 ml  Output 15 ml  Net 3170.92 ml    LBM: Last BM Date: 07/14/17 Baseline Weight: Weight: 86.2 kg (190 lb) Most recent weight: Weight: 86.2 kg (190 lb)     Palliative Assessment/Data:PPS: 10%     Thank you for this consult. Palliative medicine will continue to follow and assist as needed.   Time In: 1100 Time Out: 1330 Time Total: 150 minutes Prolonged services billed: yes Greater than 50%  of this time was spent counseling and coordinating care related to the above assessment and plan.  Signed by: Mariana Kaufman, AGNP-C Palliative Medicine  Please contact Palliative Medicine Team phone at [phone] for questions and concerns.  For individual provider: See Shea Evans

## 2017-08-10 NOTE — Progress Notes (Signed)
Chaplain took over while chaplain Janee Mornhompson went to meet. After extubation Pt died with 5 minutes. Chaplain prayed with daughter and offered a pray shawl as a remembrance of her father. Che named her son after her father and this would be helpful to pass on to him.    07/14/2017 1400  Clinical Encounter Type  Visited With Patient and family together  Visit Type Death  Referral From Chaplain  Spiritual Encounters  Spiritual Needs Grief support

## 2017-08-10 NOTE — Progress Notes (Signed)
[employer] [facility] [place] at [facility]   PATIENT NAME: Marcus Romero    MR#:  [id number]  DATE OF BIRTH:  [birth year]  SUBJECTIVE:   Patient remains intubated and sedated on vent.  Patient heart rate is going between asystole to tachycardia.  Patient appears to be actively dying.  REVIEW OF SYSTEMS:    Intubated and sedated   Tolerating Diet: npo      DRUG ALLERGIES:  Not on File  VITALS:  Blood pressure 105/78, pulse 88, temperature 98.4 F (36.9 C), temperature source Axillary, resp. rate (!) 27, height 5\' 2"  (1.575 m), weight 86.2 kg (190 lb), SpO2 100 %.  PHYSICAL EXAMINATION:  Constitutional: Ill-appearing  hENT: Normocephalic. . Intubated  eyes: Conjunctivae are normal. no scleral icterus.  Neck: Normal ROM. Neck supple. No JVD. No tracheal deviation. CVS: tachy and at times asystole, no murmurs, no gallops, no carotid bruit.  Pulmonary: Effort and breath sounds normal, no stridor, rhonchi, wheezes, rales.  Abdominal: Soft. BS +,  no distension, tenderness, rebound or guarding.  Musculoskeletal: Does not move extremities at this time Neuro: Intubated sedated   skin: Multiple areas of ulcerations and wounds blistering on lower extremities Psychiatric: Sedated    LABORATORY PANEL:   CBC Recent Labs  Lab August 05, 2017 0443  WBC 13.3*  HGB 9.2*  HCT 28.8*  PLT 46*   ------------------------------------------------------------------------------------------------------------------  Chemistries  Recent Labs  Lab 07/14/17 0353  08/05/2017 0853  NA 140  --  131*  K 4.7  --  6.8*  CL 92*  --  85*  CO2 16*  --  16*  GLUCOSE 161*  --  247*  BUN 81*  --  95*  CREATININE 7.07*   < > 6.88*  CALCIUM 7.5*  --  6.6*  MG 2.0  --   --   AST  --   --  561*  ALT  --   --  471*  ALKPHOS  --   --  75  BILITOT  --   --  7.8*   < > = values in this interval not displayed.    ------------------------------------------------------------------------------------------------------------------  Cardiac Enzymes Recent Labs  Lab 07/14/17 0840 07/14/17 1528 07/14/17 2015  TROPONINI 2.96* 3.73* 3.97*   ------------------------------------------------------------------------------------------------------------------  RADIOLOGY:  Dg Abd 1 View  Result Date: 07/23/2017 CLINICAL DATA:  MRI clearance. EXAM: ABDOMEN - 1 VIEW COMPARISON:  None. FINDINGS: Enteric tube in the stomach. Right femoral central venous catheter with tip in the lower IVC. The bowel gas pattern is normal. No radio-opaque calculi or other significant radiographic abnormality are seen. Diffuse vascular calcifications. No acute osseous abnormality. No radiopaque density in the abdomen or pelvis. IMPRESSION: No metallic density in the abdomen or pelvis. Electronically Signed   By: Obie Dredge M.D.   On: 07/18/2017 23:08   Ct Head Wo Contrast  Result Date: 07/25/2017 CLINICAL DATA:  Altered level of consciousness EXAM: CT HEAD WITHOUT CONTRAST TECHNIQUE: Contiguous axial images were obtained from the base of the skull through the vertex without intravenous contrast. COMPARISON:  None. FINDINGS: Brain: Moderate chronic microvascular disease throughout the deep white matter. Chronic appearing bilateral basal ganglia and thalamic lacunar infarcts. No acute intracranial abnormality. Specifically, no hemorrhage, hydrocephalus, mass lesion, acute infarction, or significant intracranial injury. Vascular: No hyperdense vessel or unexpected calcification. Skull: No acute calvarial abnormality. Sinuses/Orbits: Visualized paranasal sinuses and mastoids clear. Orbital soft tissues unremarkable. Other: None IMPRESSION: Chronic microvascular disease. Chronic appearing bilateral basal ganglia and thalamic lacunar infarcts. No acute  intracranial abnormality. Electronically Signed   By: Charlett Nose M.D.   On: 07/18/2017 14:01    Mr Brain Wo Contrast  Result Date: 07/14/2017 CLINICAL DATA:  Initial evaluation for acute altered mental status. History of end-stage renal disease. EXAM: MRI HEAD WITHOUT CONTRAST TECHNIQUE: Multiplanar, multiecho pulse sequences of the brain and surrounding structures were obtained without intravenous contrast. COMPARISON:  Comparison made with prior CT from 07/21/2017. FINDINGS: Brain: Moderately advanced cerebral atrophy. Patchy and confluent T2/FLAIR hyperintensity within the periventricular and deep white matter both cerebral hemispheres most consistent with chronic small vessel ischemic change, moderate nature. Superimposed remote lacunar infarcts present within the periventricular white matter of the bilateral corona radiata. Additional scatter remote lacunar infarcts present within the bilateral basal ganglia and thalami as well as the pons and left midbrain. Patchy abnormal restricted diffusion seen involving the left cerebellar hemisphere (series 100, image 13, 9), consistent with acute ischemic infarcts. Additional patchy small volume acute ischemic infarcts at the superior right cerebellum (series 100, image 15). Small 8 mm acute ischemic infarct at the anterior right insula (series 100, image 25). No associated mass effect or hemorrhage. No other evidence for acute ischemia. No mass lesion or midline shift. No hydrocephalus. No extra-axial fluid collection. Major dural sinuses grossly patent. Pituitary gland within normal limits.  Suprasellar region normal. Vascular: Major intracranial vascular flow voids are maintained. Skull and upper cervical spine: Craniocervical junction within normal limits. Degenerative disc osteophyte noted at C2-3 without significant stenosis. Bones demonstrate markedly hypointense T1 signal intensity, most anemia. No marrow replacing lesion. Scalp soft tissues unremarkable. To end-stage renal disease and chronic Sinuses/Orbits: Globes and orbital soft tissues within  normal limits. Paranasal sinuses are largely clear. Patient is intubated. No significant mastoid effusion. Inner ear structures within normal limits. Other: None. IMPRESSION: 1. Multifocal acute ischemic nonhemorrhagic infarcts involving the left greater than right cerebellar hemispheres as well as the right insula. 2. Moderately advanced cerebral atrophy with chronic small vessel ischemic disease with multiple remote lacunar infarcts as above. Electronically Signed   By: Rise Mu M.D.   On: 07/14/2017 14:57   Dg Chest Port 1 View  Result Date: Jul 17, 2017 CLINICAL DATA:  Prior CVA.  Patient is intubated. EXAM: PORTABLE CHEST 1 VIEW COMPARISON:  07/14/2017 FINDINGS: Stable position of endotracheal tube and enteric catheter. Cardiomediastinal silhouette is mildly enlarged. There is no evidence of pleural effusion or pneumothorax. Stable left lower lobe peribronchial airspace consolidation versus atelectasis. Osseous structures are without acute abnormality. Soft tissues are grossly normal. IMPRESSION: Low lung volumes. Stable left lower lobe peribronchial airspace consolidation versus atelectasis. Electronically Signed   By: Ted Mcalpine M.D.   On: 17-Jul-2017 10:42   Dg Chest Port 1 View  Result Date: 07/21/2017 CLINICAL DATA:  Intubation. EXAM: PORTABLE CHEST 1 VIEW COMPARISON:  Earlier today 1746 hours. FINDINGS: 1900 hours. Endotracheal tube has been retracted and terminates 2.9 cm above carina. Nasogastric tube looped in the stomach with tip at the proximal stomach. Midline trachea. Cardiomegaly accentuated by AP portable technique. Atherosclerosis in the transverse aorta. No right and no definite left pleural effusion. No pneumothorax. Low lung volumes with resultant pulmonary interstitial prominence. Overall improved aeration since the exam of earlier today. Persistent left base airspace disease and volume loss. IMPRESSION: Retraction of endotracheal tube, appropriately positioned 2.9 cm  above carina. Aortic Atherosclerosis (ICD10-I70.0). Improved aeration with persistent left base Airspace disease, likely atelectasis. Electronically Signed   By: Jeronimo Greaves M.D.   On: 07/27/2017 19:24  Dg Chest Port 1 View  Result Date: 07/27/2017 CLINICAL DATA:  62 year old male post endotracheal tube and nasogastric tube placement. Subsequent encounter. EXAM: PORTABLE CHEST 1 VIEW COMPARISON:  08-15-17 1:14 p.m. chest x-ray. FINDINGS: Patient has been intubated. Tip is in the right mainstem bronchus. This needs to be retracted by 4 cm. Nasogastric tube curled within the gastric fundus. Cardiomegaly. Crowding lung markings versus atelectasis left lung base. Suspect coronary artery calcification. Calcified slightly tortuous aorta. IMPRESSION: Patient has been intubated. Tip is in the right mainstem bronchus. This needs to be retracted by 4 cm. Nasogastric tube curled within the gastric fundus. Cardiomegaly. Crowding lung markings versus atelectasis left lung base. Suspect coronary artery calcification. Aortic Atherosclerosis (ICD10-I70.0). These results were called by telephone at the time of interpretation on 07/16/2017 at 6:43 pm to [place] ZealandBrittany Rudd, patients nurse, who verbally acknowledged these results. Electronically Signed   By: Lacy DuverneySteven  Olson M.D.   On: 08-15-17 18:49   Dg Chest Port 1 View  Result Date: 07/31/2017 CLINICAL DATA:  Shortness of breath. Altered mental status EXAM: PORTABLE CHEST 1 VIEW COMPARISON:  None. FINDINGS: Cardiomegaly and coronary stenting. Aortic atherosclerosis. Low lung volumes. There is no edema, consolidation, effusion, or pneumothorax. IMPRESSION: No evidence of active disease. Cardiomegaly. Electronically Signed   By: Marnee SpringJonathon  Watts M.D.   On: 08-15-17 13:47     ASSESSMENT AND PLAN:   62 year old male with PAF, end-stage renal disease on hemodialysis and CVA who presents to the emergency room after syncopal episode during dialysis.  1.  Acute hypoxic  respiratory failure: Patient is intubated and is on mechanical ventilation. Vent management as per intensivist.  2.  Syncopal episode during dialysis: CT scan was negative for acute hemorrhagic event. MRI negative for acute stroke.  EEG shows slowing  3.  Elevated troponin: As per cardiology this is due to demand ischemia.  4.  Septic shock with gram-negative rod bacteremia (Serratia) ID consultation requested Blistering lesions on legs were cultured Continue pressors and stress dose steroids Continue Zosyn and vancomycin Awaiting sensitivities 5.  Leukopenia: Bone marrow suppression which has improved 6.  Thrombocytopenia: This is related to sepsis/infectious etiology Oncology following  7.  End-stage renal disease on hemodialysis: Management as per nephrology  8.  History of CVA   She has very poor prognosis.  Patient appears to be actively dying.  Patient is DNR status.  Awaiting family for further plan.  Discussed with Dr. Lonn Georgiaonforti    CODE STATUS: NR  TOTAL TIME TAKING CARE OF THIS PATIENT: 25 minutes.     POSSIBLE D/C??, DEPENDING ON CLINICAL CONDITION.   Havanah Nelms M.D on 07/16/2017 at 10:54 AM  Between 7am to 6pm - Pager - [phone] After 6pm go to [email or web address] - password [email or web address]  [facility]  Office  [phone]  CC: Primary care physician; Patient, No Pcp Per  Note: This dictation was prepared with Dragon dictation along with smaller phrase technology. Any transcriptional errors that result from this process are unintentional.

## 2017-08-10 DEATH — deceased

## 2019-07-08 IMAGING — CT CT HEAD W/O CM
3 of 4 series · 16 of 47 positions shown, 19 images · non-contrast
Comparison: None.

CLINICAL DATA: Altered level of consciousness

EXAM:
CT HEAD WITHOUT CONTRAST
TECHNIQUE: Contiguous axial images were obtained from the base of the skull
through the vertex without intravenous contrast.

[Series 3: head wo · axial · 0.40mm/px · z∈[-78,+47]mm · 10 of 31 slices shown, 13 images]
[im 3/31  brain]
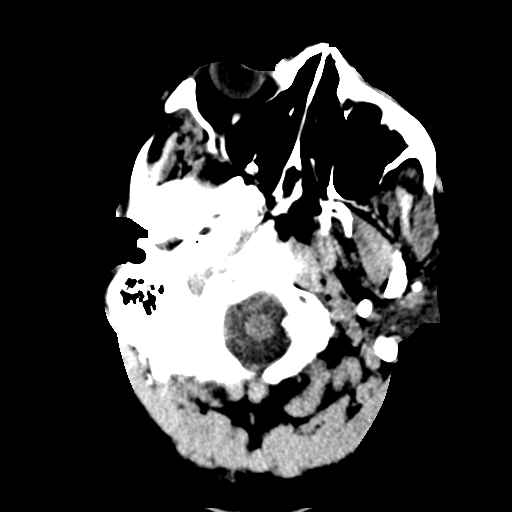
[im 3/31  bone]
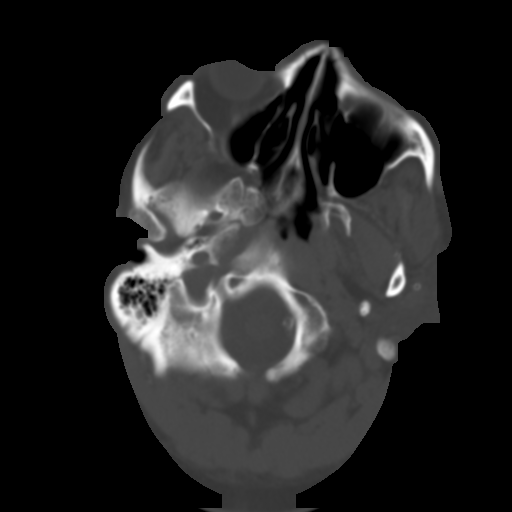
[im 5/31  brain]
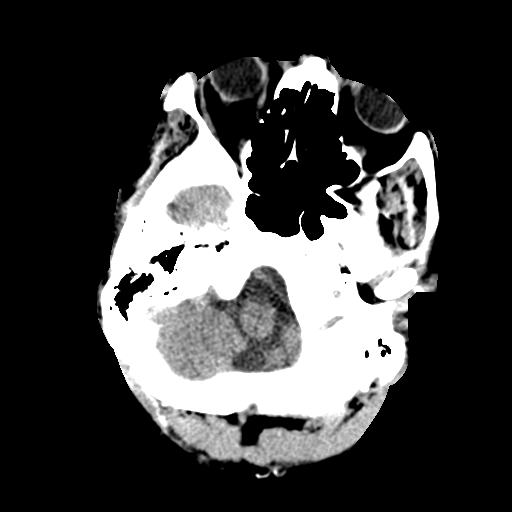
[im 9/31  brain]
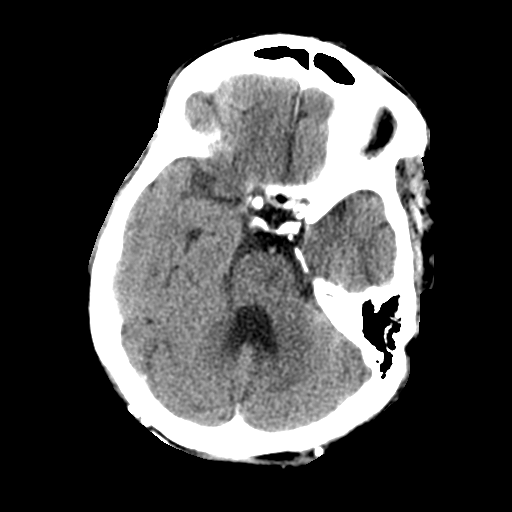
[im 11/31  brain]
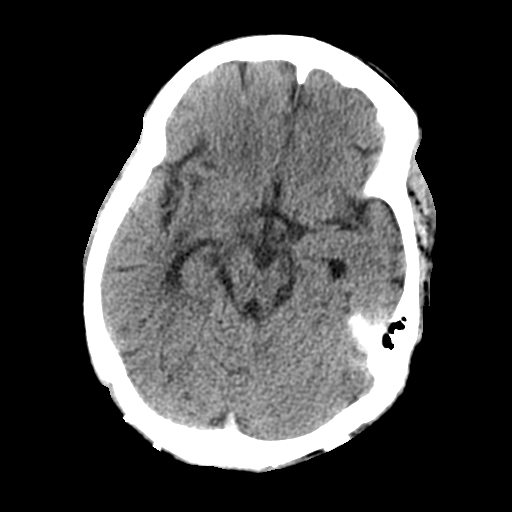
[im 13/31  brain]
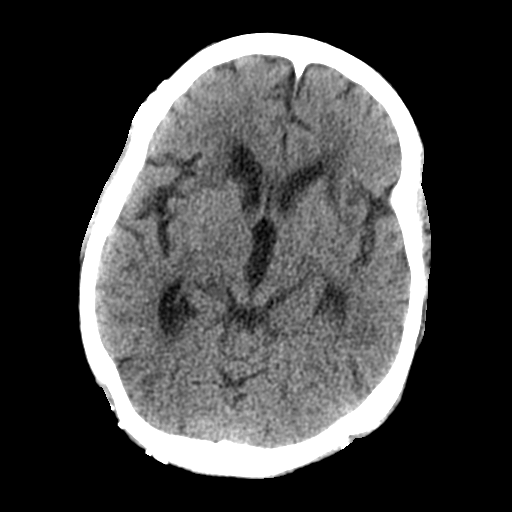
[im 13/31  bone]
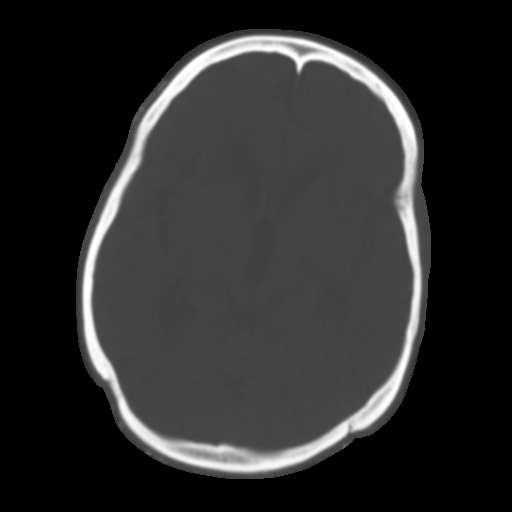
[im 18/31  brain]
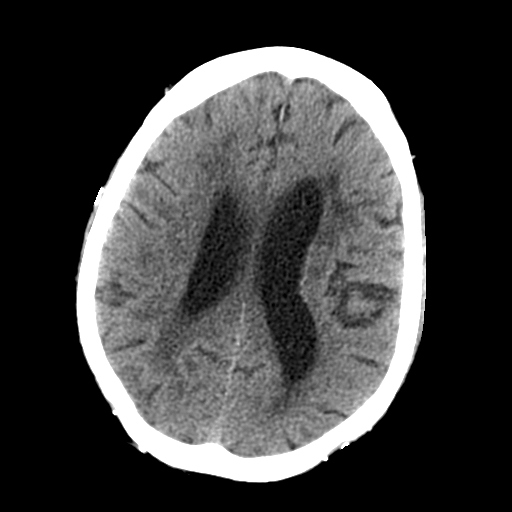
[im 20/31  brain]
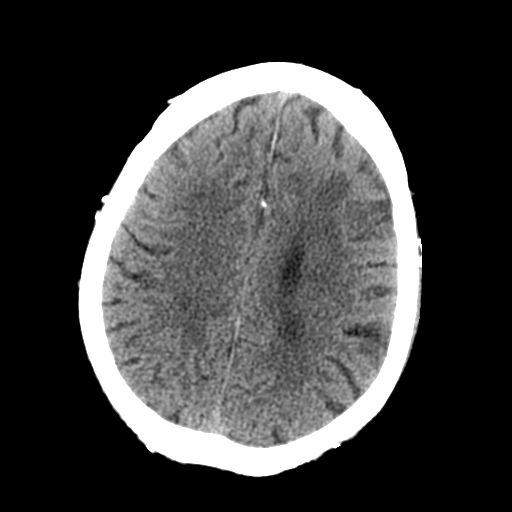
[im 22/31  brain]
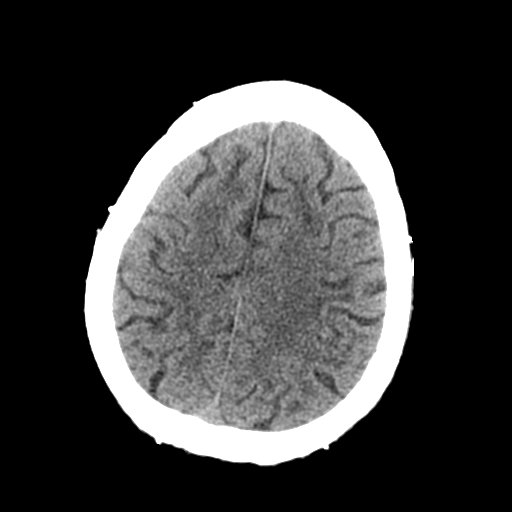
[im 26/31  brain]
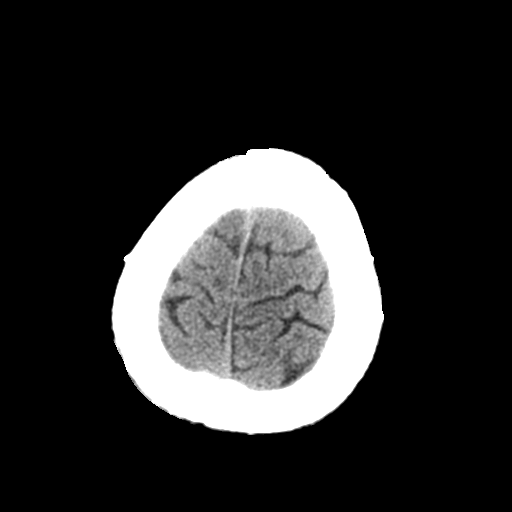
[im 26/31  bone]
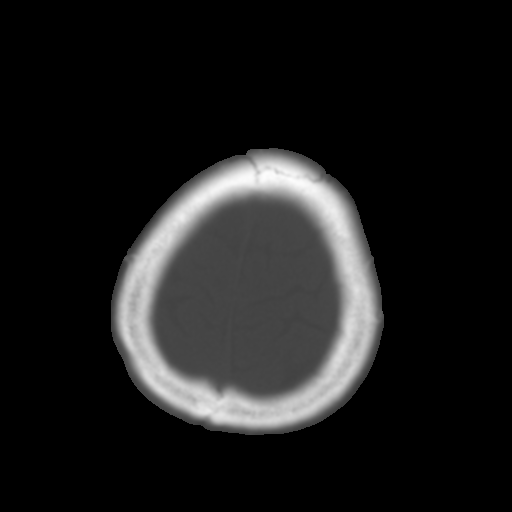
[im 28/31  brain]
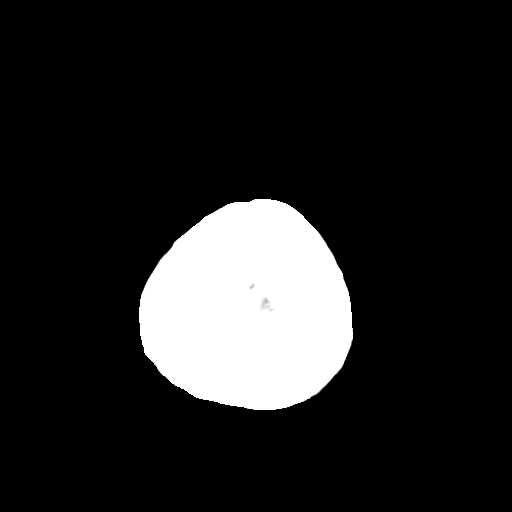

[Series 6: coronal soft tissue · coronal · 0.31mm/px · 3 of 57 slices shown]
[im 19/57  brain]
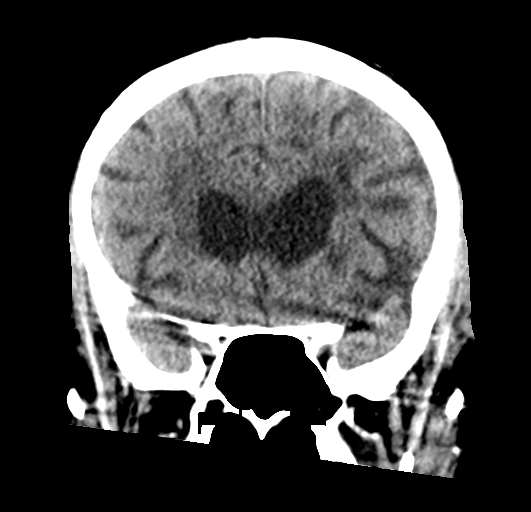
[im 25/57  brain]
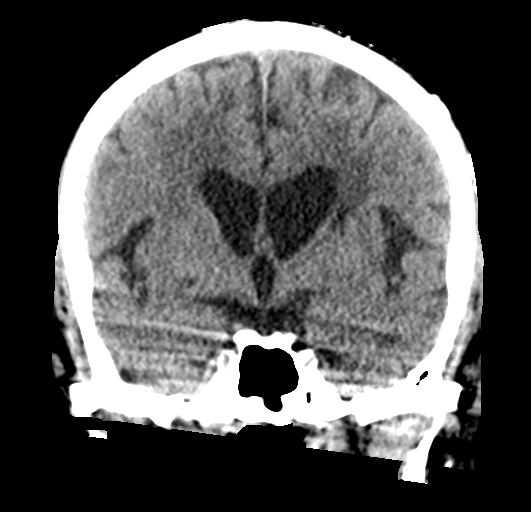
[im 32/57  brain]
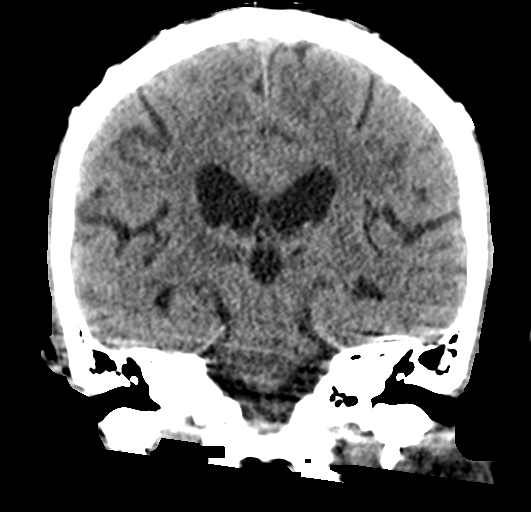

[Series 7: sagittal soft tissue · sagittal · 0.32mm/px · 3 of 49 slices shown]
[im 17/49  brain]
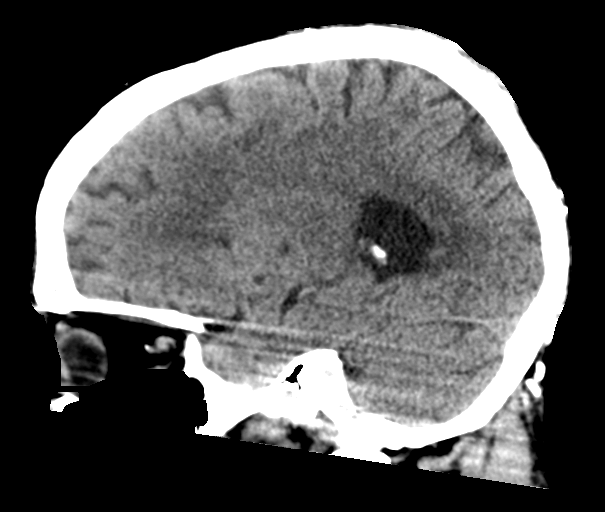
[im 25/49  brain]
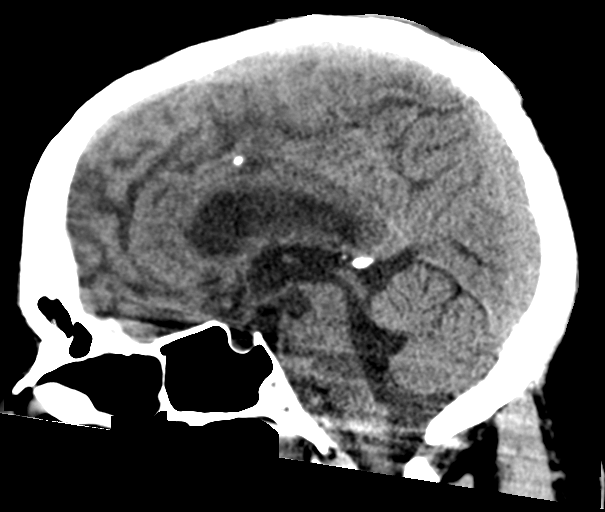
[im 33/49  brain]
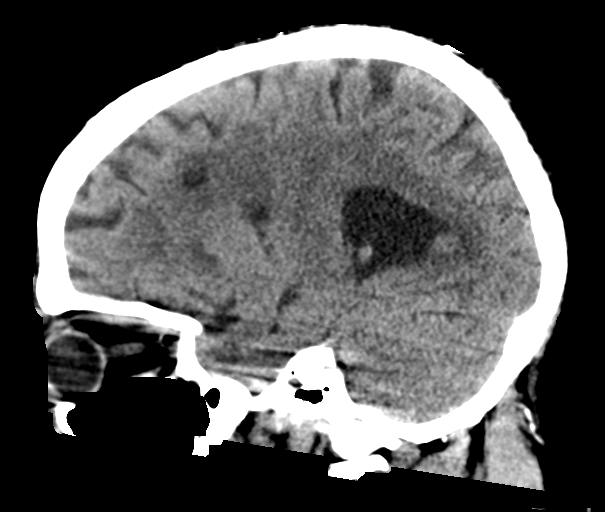

[16 of 47 positions shown; findings below may reference images not displayed]

FINDINGS: Brain: Moderate chronic microvascular disease throughout the deep
white matter. Chronic appearing bilateral basal ganglia and thalamic
lacunar infarcts. No acute intracranial abnormality. Specifically,
no hemorrhage, hydrocephalus, mass lesion, acute infarction, or
significant intracranial injury.

Vascular: No hyperdense vessel or unexpected calcification.

Skull: No acute calvarial abnormality.

Sinuses/Orbits: Visualized paranasal sinuses and mastoids clear.
Orbital soft tissues unremarkable.

Other: None
IMPRESSION: Chronic microvascular disease.

Chronic appearing bilateral basal ganglia and thalamic lacunar
infarcts.

No acute intracranial abnormality.

## 2019-07-08 IMAGING — DX DG ABDOMEN 1V
3 series · 3 of 3 positions shown · non-contrast
Comparison: None.

CLINICAL DATA: MRI clearance.

EXAM:
ABDOMEN - 1 VIEW

[abdomen supine (1 of 3)]
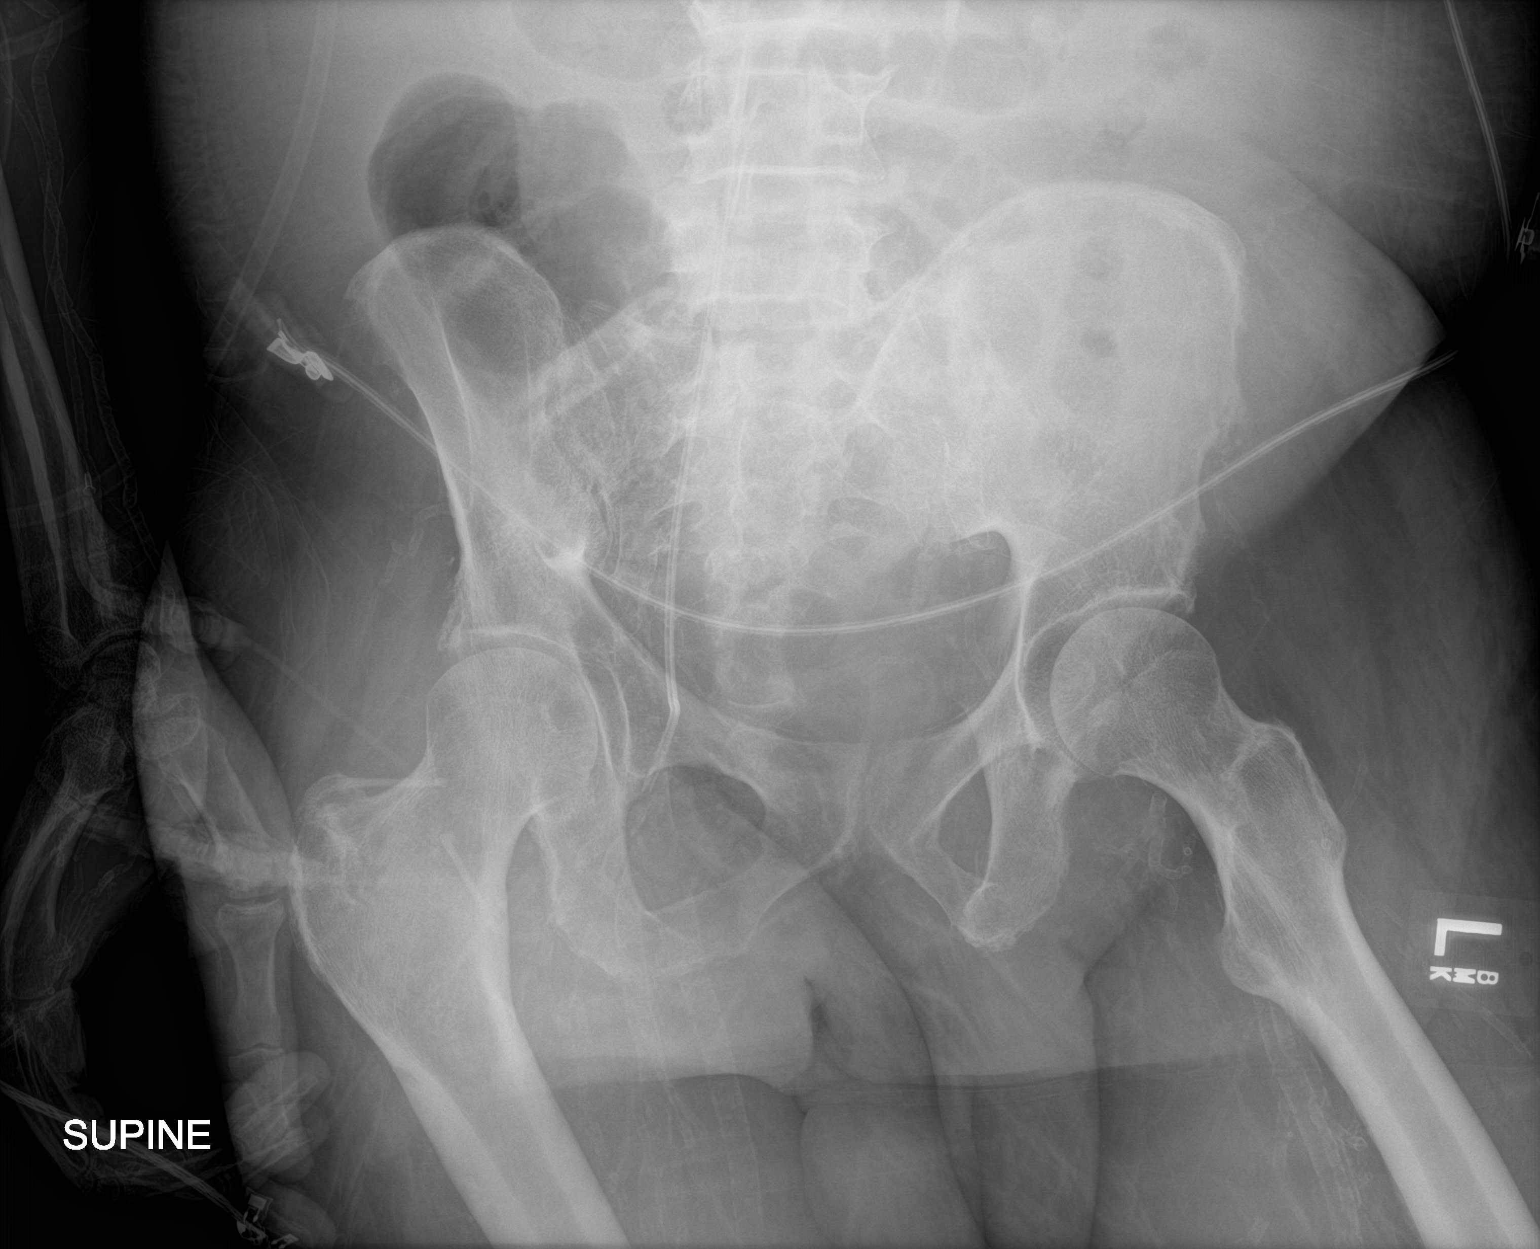

[abdomen supine (2 of 3)]
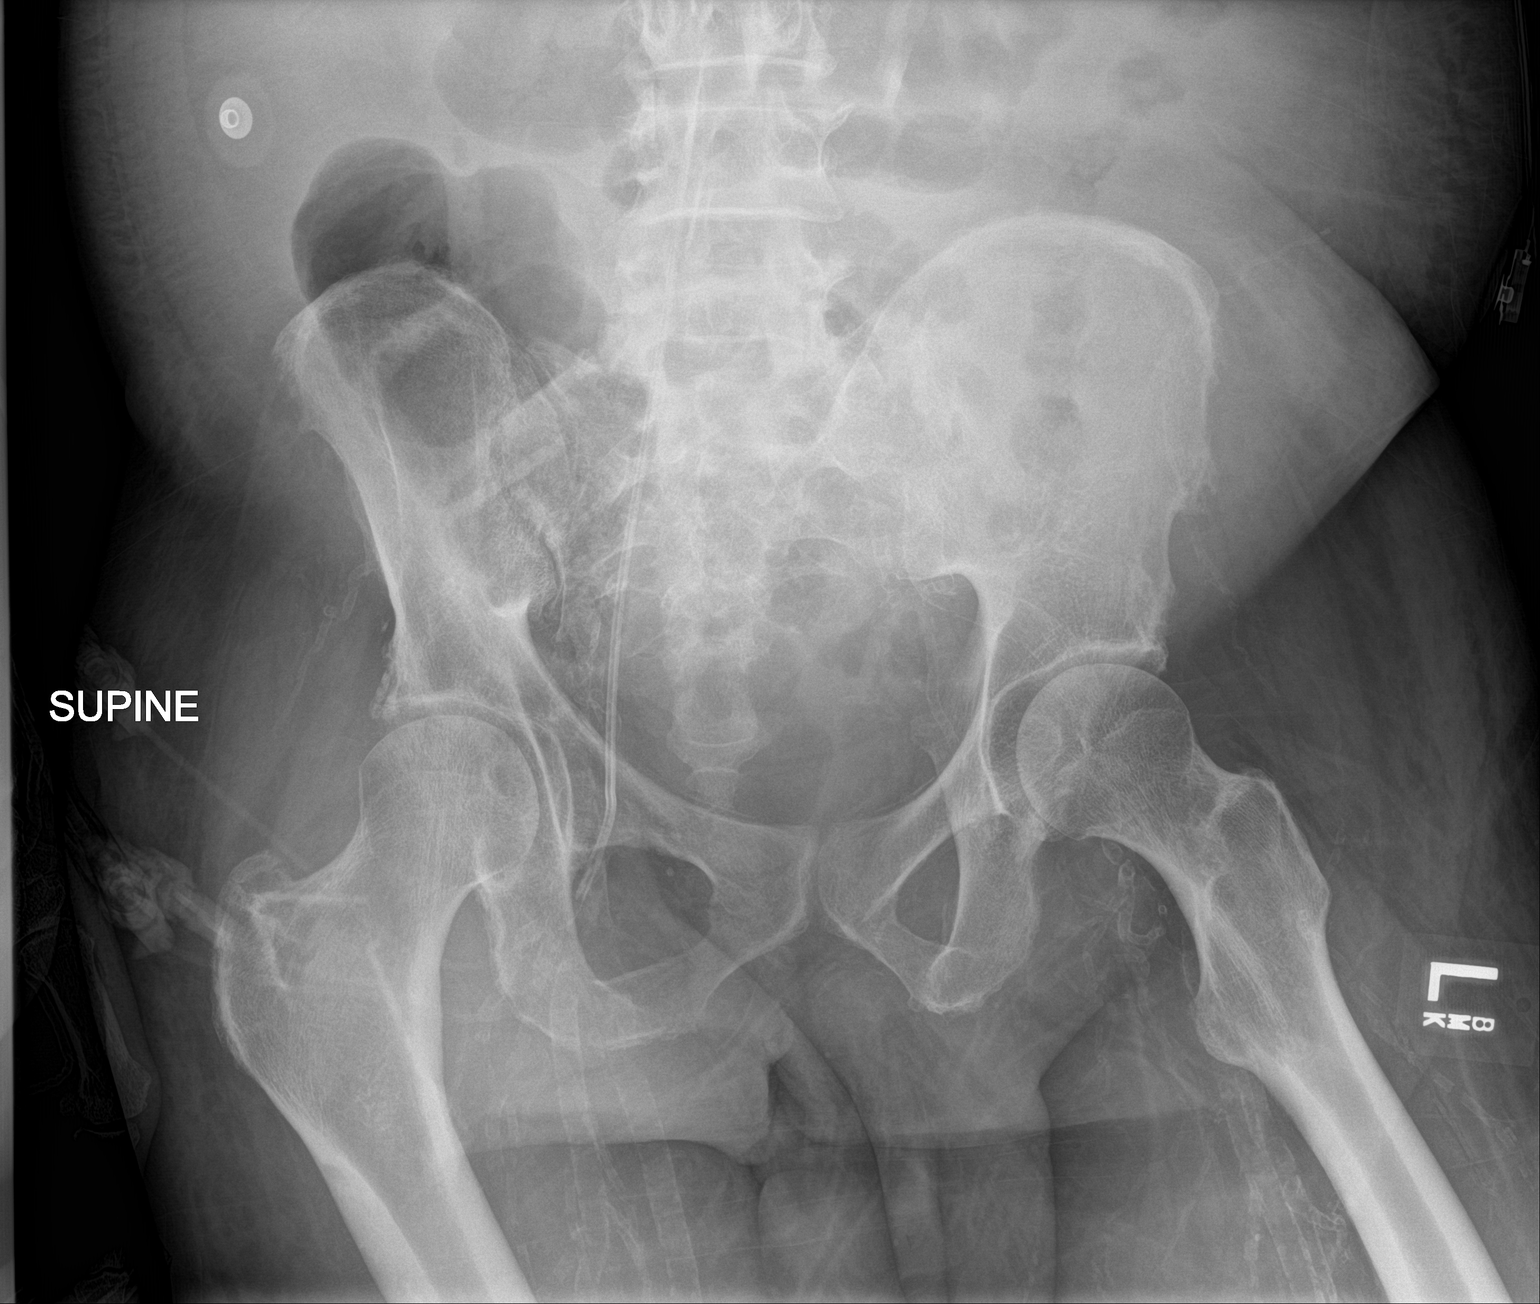

[abdomen supine (3 of 3)]
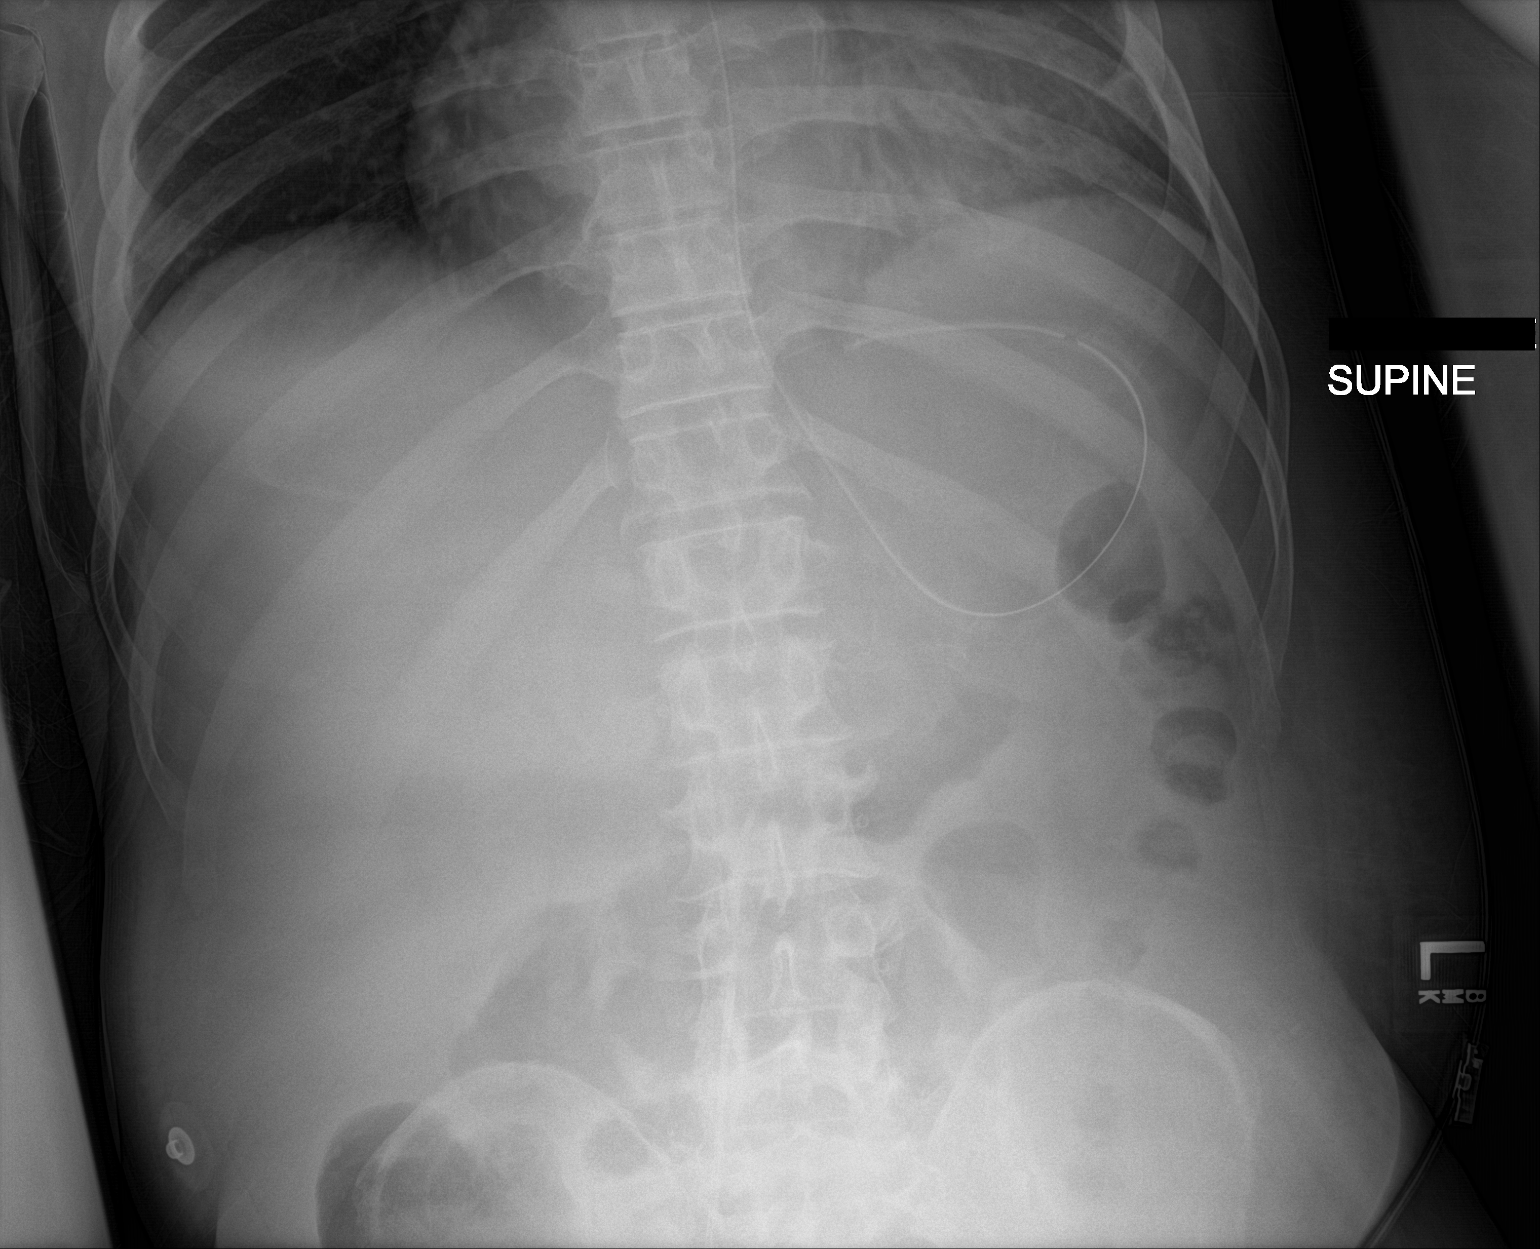

[3 of 3 positions shown; findings below may reference images not displayed]

FINDINGS: Enteric tube in the stomach. Right femoral central venous catheter
with tip in the lower IVC. The bowel gas pattern is normal. No
radio-opaque calculi or other significant radiographic abnormality
are seen. Diffuse vascular calcifications. No acute osseous
abnormality. No radiopaque density in the abdomen or pelvis.
IMPRESSION: No metallic density in the abdomen or pelvis.
